# Patient Record
Sex: Female | Born: 1995 | Race: Black or African American | Hispanic: No | Marital: Single | Smoking: Never smoker
Health system: Southern US, Community
[De-identification: ages and names within clinical notes are randomized; demographics above are authoritative.]

## PROBLEM LIST (undated history)

## (undated) ENCOUNTER — Inpatient Hospital Stay (HOSPITAL_COMMUNITY): Payer: Self-pay

## (undated) ENCOUNTER — Ambulatory Visit: Admission: EM | Payer: Medicaid Other | Source: Home / Self Care

## (undated) DIAGNOSIS — B9689 Other specified bacterial agents as the cause of diseases classified elsewhere: Secondary | ICD-10-CM

## (undated) DIAGNOSIS — J45909 Unspecified asthma, uncomplicated: Secondary | ICD-10-CM

## (undated) DIAGNOSIS — N760 Acute vaginitis: Secondary | ICD-10-CM

## (undated) HISTORY — PX: NO PAST SURGERIES: SHX2092

---
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
## 2015-02-14 ENCOUNTER — Encounter (HOSPITAL_COMMUNITY): Payer: Self-pay | Admitting: Emergency Medicine

## 2015-02-14 ENCOUNTER — Emergency Department (HOSPITAL_COMMUNITY)
Admission: EM | Admit: 2015-02-14 | Discharge: 2015-02-14 | Discharge status: Against Medical Advice | Payer: Self-pay | Attending: Emergency Medicine | Admitting: Emergency Medicine

## 2015-02-14 DIAGNOSIS — J45901 Unspecified asthma with (acute) exacerbation: Secondary | ICD-10-CM | POA: Insufficient documentation

## 2015-02-14 MED ORDER — ALBUTEROL SULFATE (2.5 MG/3ML) 0.083% IN NEBU: 5.0000 mg | INHALATION_SOLUTION | Freq: Once | RESPIRATORY_TRACT | Status: DC

## 2015-02-14 NOTE — ED Notes (Signed)
Called to treatment area x3

## 2015-02-14 NOTE — ED Notes (Signed)
Pt states she has a history of asthma and has been having flare up's "since the season changed." Presents with no obvious difficulty breathing. States she's out of her inhaler.

## 2015-02-14 NOTE — ED Notes (Signed)
Pt called to treatment area x 2

## 2015-02-27 ENCOUNTER — Emergency Department (HOSPITAL_COMMUNITY)
Admission: EM | Admit: 2015-02-27 | Discharge: 2015-02-27 | Discharge status: Against Medical Advice | Payer: Self-pay | Attending: Emergency Medicine | Admitting: Emergency Medicine

## 2015-02-27 ENCOUNTER — Encounter (HOSPITAL_COMMUNITY): Payer: Self-pay | Admitting: Emergency Medicine

## 2015-02-27 DIAGNOSIS — J45901 Unspecified asthma with (acute) exacerbation: Secondary | ICD-10-CM | POA: Insufficient documentation

## 2015-02-27 DIAGNOSIS — Z880 Allergy status to penicillin: Secondary | ICD-10-CM | POA: Insufficient documentation

## 2015-02-27 MED ORDER — PREDNISONE 20 MG PO TABS
40.0000 mg | ORAL_TABLET | Freq: Once | ORAL | Status: AC
  Administered 2015-02-27: 40 mg via ORAL
  Filled 2015-02-27: qty 2

## 2015-02-27 MED ORDER — IPRATROPIUM-ALBUTEROL 0.5-2.5 (3) MG/3ML IN SOLN
3.0000 mL | Freq: Once | RESPIRATORY_TRACT | Status: AC
  Administered 2015-02-27: 3 mL via RESPIRATORY_TRACT
  Filled 2015-02-27: qty 3

## 2015-02-27 NOTE — ED Notes (Signed)
Went to check on pt, pt was not in room. Waited 15 minutes, pt still not in room. Pt not in waiting room or bathrooms. PA notified.

## 2015-02-27 NOTE — ED Provider Notes (Signed)
CSN: [id number]     Arrival date & time 02/27/15  1218 History  This chart was scribed for non-physician practitioner Karmen Stabs, PA-C, working with Benjiman Core, MD, by Andrew Au, ED Scribe. This patient was seen in room WTR5/WTR5 and the patient's care was started at 12:47 PM.  Chief Complaint  Patient presents with  . Asthma   The history is provided by the patient. No language interpreter was used.   Jodi Roth is a 19 y.o. female with hx of allergies who presents to the Emergency Department for asthma that began last night. Patient reports symptoms of SOB, productive cough with thick yellow mucous, and chest tightness which she states are normal symptoms of asthma for her. Pt denies taking medication or using an inhaler for symptoms. She states she recently moved here 2 months ago and does not have a PCP at this time. Pt has been hospitalized in past for asthma, last hospitalization being 5 months ago. She denies fever and chills.   Past Medical History  Diagnosis Date  . Asthma    History reviewed. No pertinent past surgical history. History reviewed. No pertinent family history. History  Substance Use Topics  . Smoking status: Never Smoker   . Smokeless tobacco: Not on file  . Alcohol Use: No   OB History    No data available     Review of Systems  Constitutional: Negative for fever and chills.  Respiratory: Positive for cough and chest tightness.   Gastrointestinal: Negative for nausea and vomiting.    Allergies  Eggs or egg-derived products and Penicillins  Home Medications   Prior to Admission medications   Not on File   BP 113/63 mmHg  Pulse 78  Temp(Src) 98.1 F (36.7 C) (Oral)  Resp 18  SpO2 100%  LMP 02/14/2015 (Approximate) Physical Exam  Constitutional: She appears well-developed and well-nourished. No distress.  HENT:  Head: Normocephalic and atraumatic.  Mouth/Throat: Posterior oropharyngeal erythema ( mild) present.  Eyes:  Conjunctivae and EOM are normal. Right eye exhibits no discharge. Left eye exhibits no discharge.  Cardiovascular: Normal rate and regular rhythm.   Pulmonary/Chest: Effort normal. No respiratory distress. She has wheezes.  Mild diffuse wheezing with good air movement, no respiratory distress.   Abdominal: Soft. Bowel sounds are normal. She exhibits no distension. There is no tenderness.  Neurological: She is alert. She exhibits normal muscle tone. Coordination normal.  Skin: Skin is warm and dry. She is not diaphoretic.  Nursing note and vitals reviewed.   ED Course  Procedures  DIAGNOSTIC STUDIES: Oxygen Saturation is 100% on RA, normal by my interpretation.    COORDINATION OF CARE: 12:57 PM- Pt advised of plan for treatment which includes a breathing treatment and prednisone and pt agrees.  Labs Review Labs Reviewed - No data to display  Imaging Review No results found.   EKG Interpretation None        MDM   Final diagnoses:  Asthma exacerbation   Patient presenting with symptoms of asthma exacerbation. Pt endorses wheezing and SOB typical of asthma flare. Pt denies worsening than normal. Pt denies PCP and states she has not been using inhalers. VSS. No hypoxia or tachypnea. No fevers. No respiratory distress. Pt endorses sputum production but states it is normal for her allergies. No fevers in ED. Pt given option of CXR and refused. I doubt PNA. Pt with mild asthma exacerbation. Pt given breathing treatment and prednisone in ED. Pt left before reassessment. Pt did not inform  nursing staff or provider. Had discussed prior to elopement return precautions and pt verbalized understanding. 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   I personally performed the services described in this documentation, which was scribed in my presence. The recorded information has been reviewed and is accurate.   Oswaldo ConroyVictoria Briani Maul, PA-C 02/27/15 1508  Benjiman CoreNathan Pickering,  MD 02/28/15 [phone]

## 2015-02-27 NOTE — ED Notes (Signed)
Pt reports asthma exacerbation starting 2 days ago while cleaning. Denies SOB but just feels "tightness" when she breathes. Attempted to be seen at PCP but says they didn't accept walk-ins. Slight wheezing on expiration initially, lung sounds coarse. RR even/unlabored. Requests a "proair inhaler." No other c/c. Speaking full/clear sentences.

## 2015-07-27 ENCOUNTER — Emergency Department (HOSPITAL_COMMUNITY)
Admission: EM | Admit: 2015-07-27 | Discharge: 2015-07-27 | Discharge status: Against Medical Advice | Payer: Self-pay | Attending: Emergency Medicine | Admitting: Emergency Medicine

## 2015-07-27 ENCOUNTER — Encounter (HOSPITAL_COMMUNITY): Payer: Self-pay | Admitting: Emergency Medicine

## 2015-07-27 DIAGNOSIS — J45909 Unspecified asthma, uncomplicated: Secondary | ICD-10-CM | POA: Insufficient documentation

## 2015-07-27 DIAGNOSIS — N898 Other specified noninflammatory disorders of vagina: Secondary | ICD-10-CM | POA: Insufficient documentation

## 2015-07-27 NOTE — ED Notes (Signed)
CALLED FOR ROOM X3

## 2015-07-27 NOTE — ED Notes (Signed)
C/o vaginal itching and dc X 3days, no V/D or abd pain, A/O X4, NAD

## 2015-09-24 ENCOUNTER — Encounter (HOSPITAL_COMMUNITY): Payer: Self-pay | Admitting: Emergency Medicine

## 2015-09-24 ENCOUNTER — Emergency Department (HOSPITAL_COMMUNITY): Payer: Self-pay

## 2015-09-24 ENCOUNTER — Emergency Department (HOSPITAL_COMMUNITY)
Admission: EM | Admit: 2015-09-24 | Discharge: 2015-09-24 | Disposition: A | Discharge status: Home / Self Care | Payer: Self-pay | Attending: Emergency Medicine | Admitting: Emergency Medicine

## 2015-09-24 DIAGNOSIS — Z880 Allergy status to penicillin: Secondary | ICD-10-CM | POA: Insufficient documentation

## 2015-09-24 DIAGNOSIS — N760 Acute vaginitis: Secondary | ICD-10-CM

## 2015-09-24 DIAGNOSIS — O23599 Infection of other part of genital tract in pregnancy, unspecified trimester: Secondary | ICD-10-CM | POA: Insufficient documentation

## 2015-09-24 DIAGNOSIS — R109 Unspecified abdominal pain: Secondary | ICD-10-CM

## 2015-09-24 DIAGNOSIS — O99519 Diseases of the respiratory system complicating pregnancy, unspecified trimester: Secondary | ICD-10-CM | POA: Insufficient documentation

## 2015-09-24 DIAGNOSIS — B379 Candidiasis, unspecified: Secondary | ICD-10-CM | POA: Insufficient documentation

## 2015-09-24 DIAGNOSIS — Z3A00 Weeks of gestation of pregnancy not specified: Secondary | ICD-10-CM | POA: Insufficient documentation

## 2015-09-24 DIAGNOSIS — J45909 Unspecified asthma, uncomplicated: Secondary | ICD-10-CM | POA: Insufficient documentation

## 2015-09-24 DIAGNOSIS — B9689 Other specified bacterial agents as the cause of diseases classified elsewhere: Secondary | ICD-10-CM

## 2015-09-24 DIAGNOSIS — R1030 Lower abdominal pain, unspecified: Secondary | ICD-10-CM | POA: Insufficient documentation

## 2015-09-24 DIAGNOSIS — N898 Other specified noninflammatory disorders of vagina: Secondary | ICD-10-CM

## 2015-09-24 DIAGNOSIS — Z3490 Encounter for supervision of normal pregnancy, unspecified, unspecified trimester: Secondary | ICD-10-CM

## 2015-09-24 DIAGNOSIS — O98819 Other maternal infectious and parasitic diseases complicating pregnancy, unspecified trimester: Secondary | ICD-10-CM | POA: Insufficient documentation

## 2015-09-24 LAB — CBC WITH DIFFERENTIAL/PLATELET
BASOS ABS: 0 10*3/uL (ref 0.0–0.1)
Basophils Relative: 0 %
Eosinophils Absolute: 0.2 10*3/uL (ref 0.0–0.7)
Eosinophils Relative: 2 %
HEMATOCRIT: 34.8 % — AB (ref 36.0–46.0)
Hemoglobin: 11.4 g/dL — ABNORMAL LOW (ref 12.0–15.0)
LYMPHS PCT: 25 %
Lymphs Abs: 2.2 10*3/uL (ref 0.7–4.0)
MCH: 27.6 pg (ref 26.0–34.0)
MCHC: 32.8 g/dL (ref 30.0–36.0)
MCV: 84.3 fL (ref 78.0–100.0)
MONO ABS: 0.7 10*3/uL (ref 0.1–1.0)
MONOS PCT: 8 %
Neutro Abs: 5.8 10*3/uL (ref 1.7–7.7)
Neutrophils Relative %: 65 %
Platelets: 325 10*3/uL (ref 150–400)
RBC: 4.13 MIL/uL (ref 3.87–5.11)
RDW: 14.6 % (ref 11.5–15.5)
WBC: 8.9 10*3/uL (ref 4.0–10.5)

## 2015-09-24 LAB — COMPREHENSIVE METABOLIC PANEL
ALT: 14 U/L (ref 14–54)
ANION GAP: 6 (ref 5–15)
AST: 20 U/L (ref 15–41)
Albumin: 3.9 g/dL (ref 3.5–5.0)
Alkaline Phosphatase: 51 U/L (ref 38–126)
BILIRUBIN TOTAL: 0.4 mg/dL (ref 0.3–1.2)
BUN: 10 mg/dL (ref 6–20)
CALCIUM: 9.3 mg/dL (ref 8.9–10.3)
CO2: 21 mmol/L — ABNORMAL LOW (ref 22–32)
Chloride: 109 mmol/L (ref 101–111)
Creatinine, Ser: 0.71 mg/dL (ref 0.44–1.00)
Glucose, Bld: 88 mg/dL (ref 65–99)
POTASSIUM: 3.8 mmol/L (ref 3.5–5.1)
Sodium: 136 mmol/L (ref 135–145)
TOTAL PROTEIN: 6.8 g/dL (ref 6.5–8.1)

## 2015-09-24 LAB — URINALYSIS, ROUTINE W REFLEX MICROSCOPIC
Bilirubin Urine: NEGATIVE
Glucose, UA: NEGATIVE mg/dL
Hgb urine dipstick: NEGATIVE
KETONES UR: NEGATIVE mg/dL
LEUKOCYTES UA: NEGATIVE
NITRITE: NEGATIVE
Protein, ur: NEGATIVE mg/dL
Specific Gravity, Urine: 1.022 (ref 1.005–1.030)
pH: 8.5 — ABNORMAL HIGH (ref 5.0–8.0)

## 2015-09-24 LAB — WET PREP, GENITAL
Sperm: NONE SEEN
TRICH WET PREP: NONE SEEN

## 2015-09-24 LAB — URINE MICROSCOPIC-ADD ON: Bacteria, UA: NONE SEEN

## 2015-09-24 LAB — HCG, QUANTITATIVE, PREGNANCY: hCG, Beta Chain, Quant, S: 7386 m[IU]/mL — ABNORMAL HIGH (ref <5)

## 2015-09-24 LAB — POC URINE PREG, ED: Preg Test, Ur: POSITIVE — AB

## 2015-09-24 MED ORDER — METRONIDAZOLE 500 MG PO TABS: 500.0000 mg | ORAL_TABLET | Freq: Two times a day (BID) | ORAL | Status: AC

## 2015-09-24 MED ORDER — CLOTRIMAZOLE 1 % VA CREA: 1.0000 | TOPICAL_CREAM | Freq: Every day | VAGINAL | Status: AC

## 2015-09-24 NOTE — ED Provider Notes (Signed)
CSN: [id number]     Arrival date & time 09/24/15  1912 History   First MD Initiated Contact with Patient 09/24/15 1922     Chief Complaint  Patient presents with  . Abdominal Pain     (Consider location/radiation/quality/duration/timing/severity/associated sxs/prior Treatment) Patient is a 19 y.o. female presenting with abdominal pain.  Abdominal Pain Pain location:  Suprapubic Pain quality: sharp   Pain radiates to:  Does not radiate Pain severity:  Severe Onset quality:  Sudden Duration:  1 week Timing:  Intermittent Progression:  Worsening Chronicity:  New Relieved by:  None tried Worsened by:  Nothing tried Associated symptoms: vaginal discharge (clear)   Associated symptoms: no anorexia, no chest pain, no cough, no diarrhea, no fatigue, no fever, no nausea, no shortness of breath, no sore throat, no vaginal bleeding and no vomiting   Risk factors: has not had multiple surgeries     Past Medical History  Diagnosis Date  . Asthma    History reviewed. No pertinent past surgical history. No family history on file. Social History  Substance Use Topics  . Smoking status: Never Smoker   . Smokeless tobacco: None  . Alcohol Use: No   OB History    No data available     Review of Systems  Constitutional: Negative for fever and fatigue.  HENT: Negative for sore throat.   Eyes: Negative for visual disturbance.  Respiratory: Negative for cough and shortness of breath.   Cardiovascular: Negative for chest pain.  Gastrointestinal: Positive for abdominal pain. Negative for nausea, vomiting, diarrhea and anorexia.  Genitourinary: Positive for vaginal discharge (clear). Negative for vaginal bleeding and difficulty urinating.  Musculoskeletal: Negative for back pain and neck pain.  Skin: Negative for rash.  Neurological: Negative for syncope and headaches.      Allergies  Penicillins and Eggs or egg-derived products  Home Medications   Prior to Admission  medications   Medication Sig Start Date End Date Taking? Authorizing Provider  albuterol (PROVENTIL HFA;VENTOLIN HFA) 108 (90 BASE) MCG/ACT inhaler Inhale 2 puffs into the lungs every 6 (six) hours as needed for wheezing or shortness of breath.   Yes Historical Provider, MD  clotrimazole (GYNE-LOTRIMIN) 1 % vaginal cream Place 1 Applicatorful vaginally at bedtime. 09/24/15 10/01/15  Alvira Monday, MD  metroNIDAZOLE (FLAGYL) 500 MG tablet Take 1 tablet (500 mg total) by mouth 2 (two) times daily. 09/24/15 10/01/15  Alvira Monday, MD   BP 118/70 mmHg  Pulse 81  Temp(Src) 98.8 F (37.1 C) (Oral)  Resp 14  SpO2 100% Physical Exam  Constitutional: She is oriented to person, place, and time. She appears well-developed and well-nourished. No distress.  HENT:  Head: Normocephalic and atraumatic.  Eyes: Conjunctivae and EOM are normal.  Neck: Normal range of motion.  Cardiovascular: Normal rate, regular rhythm, normal heart sounds and intact distal pulses.  Exam reveals no gallop and no friction rub.   No murmur heard. Pulmonary/Chest: Effort normal and breath sounds normal. No respiratory distress. She has no wheezes. She has no rales.  Abdominal: Soft. She exhibits no distension. There is tenderness (mild suprapubic). There is no guarding.  Genitourinary: Uterus is enlarged. Uterus is not tender. Cervix exhibits discharge. Cervix exhibits no motion tenderness. Right adnexum displays no tenderness and no fullness. Left adnexum displays no tenderness and no fullness.  Musculoskeletal: She exhibits no edema or tenderness.  Neurological: She is alert and oriented to person, place, and time.  Skin: Skin is warm and dry. No rash noted. She  is not diaphoretic. No erythema.  Nursing note and vitals reviewed.   ED Course  Procedures (including critical care time) Labs Review Labs Reviewed  WET PREP, GENITAL - Abnormal; Notable for the following:    Yeast Wet Prep HPF POC PRESENT (*)    Clue  Cells Wet Prep HPF POC PRESENT (*)    WBC, Wet Prep HPF POC MANY (*)    All other components within normal limits  URINALYSIS, ROUTINE W REFLEX MICROSCOPIC (NOT AT [facility]) - Abnormal; Notable for the following:    APPearance TURBID (*)    pH 8.5 (*)    All other components within normal limits  HCG, QUANTITATIVE, PREGNANCY - Abnormal; Notable for the following:    hCG, Beta Chain, Quant, S 7386 (*)    All other components within normal limits  CBC WITH DIFFERENTIAL/PLATELET - Abnormal; Notable for the following:    Hemoglobin 11.4 (*)    HCT 34.8 (*)    All other components within normal limits  COMPREHENSIVE METABOLIC PANEL - Abnormal; Notable for the following:    CO2 21 (*)    All other components within normal limits  URINE MICROSCOPIC-ADD ON - Abnormal; Notable for the following:    Squamous Epithelial / LPF 6-30 (*)    All other components within normal limits  POC URINE PREG, ED - Abnormal; Notable for the following:    Preg Test, Ur POSITIVE (*)    All other components within normal limits  RPR  HIV ANTIBODY (ROUTINE TESTING)  GC/CHLAMYDIA PROBE AMP ([place]) NOT AT [facility]    Imaging Review [place] Ob Comp Less 14 Wks  09/24/2015  CLINICAL DATA:  Abdominal pain in first trimester pregnancy. EXAM: OBSTETRIC <14 WK US AND TRANSVAGINAL OB US TECHNIQUE: Both transabdominal and transvaginal ultrasound examinations were performed for complete evaluation of the gestation as well as the maternal uterus, adnexal regions, and pelvic cul-de-sac. Transvaginal technique was performed to assess early pregnancy. COMPARISON:  None. FINDINGS: Intrauterine gestational sac: Likely present Yolk sac:  Not confidently identified Embryo:  Not seen MSD: 7.3  mm   5 w   3  d         US EDC: 05/23/2016 Maternal uterus/adnexae: Physiologic appearance of the ovaries. No free pelvic fluid or subchorionic hemorrhage. IMPRESSION: Probable early intrauterine gestational sac, but no yolk sac or fetal pole yet  visualized. Recommend follow-up quantitative B-HCG levels and follow-up US in 14 days to confirm and assess viability. This recommendation follows SRU consensus guidelines: Diagnostic Criteria for Nonviable Pregnancy Early in the First Trimester. Malva Limes Engl J Med 2013; 161:0960-45; 369:1443-51. Electronically Signed   By: Marnee SpringJonathon  Watts M.D.   On: 09/24/2015 22:44   [place] Ob Transvaginal  09/24/2015  CLINICAL DATA:  Abdominal pain in first trimester pregnancy. EXAM: OBSTETRIC <14 WK US AND TRANSVAGINAL OB US TECHNIQUE: Both transabdominal and transvaginal ultrasound examinations were performed for complete evaluation of the gestation as well as the maternal uterus, adnexal regions, and pelvic cul-de-sac. Transvaginal technique was performed to assess early pregnancy. COMPARISON:  None. FINDINGS: Intrauterine gestational sac: Likely present Yolk sac:  Not confidently identified Embryo:  Not seen MSD: 7.3  mm   5 w   3  d         US EDC: 05/23/2016 Maternal uterus/adnexae: Physiologic appearance of the ovaries. No free pelvic fluid or subchorionic hemorrhage. IMPRESSION: Probable early intrauterine gestational sac, but no yolk sac or fetal pole yet visualized. Recommend follow-up quantitative B-HCG levels and follow-up [place]  in 14 days to confirm and assess viability. This recommendation follows SRU consensus guidelines: Diagnostic Criteria for Nonviable Pregnancy Early in the First Trimester. Malva Limes Med 2013; 409:8119-14. Electronically Signed   By: Marnee Spring M.D.   On: 09/24/2015 22:44   I have personally reviewed and evaluated these images and lab results as part of my medical decision-making.   EKG Interpretation None      MDM   Final diagnoses:  Abdominal pain  Pregnancy of unclear location  Vaginal discharge  Yeast infection  Bacterial vaginosis   19 year old female in no static and medical history presents with concern of lower abdominal pain. Pregnancy test is positive. Genital exam was ordered to  evaluate for signs of ectopic pregnancy and shows a gestational sac with no sign of yolk sac or fetal pole. Discussed with patient that this still represents a pregnancy of unknown location, and recommend follow-up hCG in 2 days as well as ultrasound 2 weeks.  Have low suspicion for other etiology of abdominal pain. Wet prep shows yeast, and patient was given topical treatment prescription, clue cells are present and she is given a prescription for Flagyl. Discussed that would send gonorrhea, chlamydia, HIV, RPR and patient will be called regarding these findings. Recommended follow-up with the health department or women's clinic for further evaluation. Recommended prenatal vitamins, and avoidance of other medications are not recommended in pregnancy.  Patient discharged in stable condition with understanding of reasons to return.  Pt to have close follow up for pregnancy of unknown location.   Alvira Monday, MD 09/25/15 [phone]

## 2015-09-24 NOTE — ED Notes (Signed)
Pt. reports intermittent low abdominal pain onset last week , denies pain at arrival , no emesis or diarrhea , denies dysuria or fever.

## 2015-09-24 NOTE — ED Notes (Signed)
Patient transported to Ultrasound 

## 2015-09-25 LAB — GC/CHLAMYDIA PROBE AMP (~~LOC~~) NOT AT ARMC
Chlamydia: NEGATIVE
Neisseria Gonorrhea: NEGATIVE

## 2015-09-25 LAB — RPR: RPR: NONREACTIVE

## 2015-09-25 LAB — HIV ANTIBODY (ROUTINE TESTING W REFLEX): HIV Screen 4th Generation wRfx: NONREACTIVE

## 2015-09-27 ENCOUNTER — Other Ambulatory Visit: Payer: Self-pay

## 2015-09-27 DIAGNOSIS — Z3490 Encounter for supervision of normal pregnancy, unspecified, unspecified trimester: Secondary | ICD-10-CM

## 2015-09-27 NOTE — Progress Notes (Unsigned)
Patient here for repeat bhcg. ER notes recommend f/u ultrasound in 2 weeks. Scheduled for 1/4 @ 9am. Patient informed and had no questions

## 2015-09-28 LAB — HCG, QUANTITATIVE, PREGNANCY: HCG, BETA CHAIN, QUANT, S: 14630.6 m[IU]/mL — AB

## 2015-10-08 NOTE — L&D Delivery Note (Signed)
Delivery Note Pt pushed well and at 1352 a viable female was delivered via  (Presentation: ;LOA  ).  APGAR: 8, 9 ; weight  5lb 4.3oz.   Placenta status: spont, intact .  Cord:  3 vessel   Anesthesia:  epidural Episiotomy:  none Lacerations:  1st degree periurethral Suture Repair: 3.0 monocryl Est. Blood Loss (mL):  100  Mom to postpartum.  Baby to Couplet care / Skin to Skin.  Dr Chanetta Marshall did the delivery and repair under my supervision.  Cam Hai CNM 05/08/2016, 4:20 PM

## 2015-10-11 ENCOUNTER — Ambulatory Visit (HOSPITAL_COMMUNITY)
Admission: RE | Admit: 2015-10-11 | Discharge: 2015-10-11 | Disposition: A | Discharge status: Home / Self Care | Payer: BLUE CROSS/BLUE SHIELD | Source: Ambulatory Visit | Attending: Family Medicine | Admitting: Family Medicine

## 2015-10-11 DIAGNOSIS — Z3490 Encounter for supervision of normal pregnancy, unspecified, unspecified trimester: Secondary | ICD-10-CM

## 2015-10-11 DIAGNOSIS — Z36 Encounter for antenatal screening of mother: Secondary | ICD-10-CM | POA: Diagnosis not present

## 2015-10-16 ENCOUNTER — Other Ambulatory Visit: Payer: Self-pay | Admitting: Obstetrics and Gynecology

## 2015-10-16 DIAGNOSIS — Z3490 Encounter for supervision of normal pregnancy, unspecified, unspecified trimester: Secondary | ICD-10-CM | POA: Insufficient documentation

## 2015-11-15 LAB — OB RESULTS CONSOLE HEPATITIS B SURFACE ANTIGEN: Hepatitis B Surface Ag: NEGATIVE

## 2015-11-15 LAB — OB RESULTS CONSOLE RUBELLA ANTIBODY, IGM: RUBELLA: IMMUNE

## 2015-11-28 ENCOUNTER — Emergency Department (HOSPITAL_COMMUNITY)
Admission: EM | Admit: 2015-11-28 | Discharge: 2015-11-28 | Disposition: A | Discharge status: Home / Self Care | Payer: BLUE CROSS/BLUE SHIELD | Source: Home / Self Care

## 2015-11-28 ENCOUNTER — Inpatient Hospital Stay (HOSPITAL_COMMUNITY)
Admission: AD | Admit: 2015-11-28 | Discharge: 2015-11-28 | Disposition: A | Discharge status: Home / Self Care | Payer: BLUE CROSS/BLUE SHIELD | Source: Ambulatory Visit | Attending: Obstetrics | Admitting: Obstetrics

## 2015-11-28 ENCOUNTER — Encounter (HOSPITAL_COMMUNITY): Payer: Self-pay | Admitting: Cardiology

## 2015-11-28 ENCOUNTER — Encounter (HOSPITAL_COMMUNITY): Payer: Self-pay | Admitting: *Deleted

## 2015-11-28 DIAGNOSIS — R109 Unspecified abdominal pain: Secondary | ICD-10-CM

## 2015-11-28 DIAGNOSIS — Z880 Allergy status to penicillin: Secondary | ICD-10-CM | POA: Insufficient documentation

## 2015-11-28 DIAGNOSIS — O26899 Other specified pregnancy related conditions, unspecified trimester: Secondary | ICD-10-CM

## 2015-11-28 DIAGNOSIS — O99511 Diseases of the respiratory system complicating pregnancy, first trimester: Secondary | ICD-10-CM

## 2015-11-28 DIAGNOSIS — M549 Dorsalgia, unspecified: Secondary | ICD-10-CM

## 2015-11-28 DIAGNOSIS — O9989 Other specified diseases and conditions complicating pregnancy, childbirth and the puerperium: Secondary | ICD-10-CM

## 2015-11-28 DIAGNOSIS — J45909 Unspecified asthma, uncomplicated: Secondary | ICD-10-CM | POA: Insufficient documentation

## 2015-11-28 DIAGNOSIS — N949 Unspecified condition associated with female genital organs and menstrual cycle: Secondary | ICD-10-CM

## 2015-11-28 DIAGNOSIS — Z3A14 14 weeks gestation of pregnancy: Secondary | ICD-10-CM

## 2015-11-28 DIAGNOSIS — R102 Pelvic and perineal pain: Secondary | ICD-10-CM | POA: Diagnosis not present

## 2015-11-28 LAB — COMPREHENSIVE METABOLIC PANEL
ALT: 12 U/L — ABNORMAL LOW (ref 14–54)
ANION GAP: 9 (ref 5–15)
AST: 17 U/L (ref 15–41)
Albumin: 3.6 g/dL (ref 3.5–5.0)
Alkaline Phosphatase: 33 U/L — ABNORMAL LOW (ref 38–126)
BUN: 7 mg/dL (ref 6–20)
CHLORIDE: 106 mmol/L (ref 101–111)
CO2: 21 mmol/L — AB (ref 22–32)
Calcium: 9.3 mg/dL (ref 8.9–10.3)
Creatinine, Ser: 0.58 mg/dL (ref 0.44–1.00)
GFR calc non Af Amer: >60 mL/min (ref >60)
Glucose, Bld: 88 mg/dL (ref 65–99)
POTASSIUM: 4 mmol/L (ref 3.5–5.1)
SODIUM: 136 mmol/L (ref 135–145)
Total Bilirubin: 0.5 mg/dL (ref 0.3–1.2)
Total Protein: 6.7 g/dL (ref 6.5–8.1)

## 2015-11-28 LAB — URINALYSIS, ROUTINE W REFLEX MICROSCOPIC
Bilirubin Urine: NEGATIVE
Glucose, UA: NEGATIVE mg/dL
Hgb urine dipstick: NEGATIVE
Ketones, ur: NEGATIVE mg/dL
Leukocytes, UA: NEGATIVE
Nitrite: NEGATIVE
Protein, ur: NEGATIVE mg/dL
SPECIFIC GRAVITY, URINE: 1.02 (ref 1.005–1.030)
pH: 7.5 (ref 5.0–8.0)

## 2015-11-28 LAB — CBC
HEMATOCRIT: 33.4 % — AB (ref 36.0–46.0)
HEMOGLOBIN: 11.2 g/dL — AB (ref 12.0–15.0)
MCH: 28 pg (ref 26.0–34.0)
MCHC: 33.5 g/dL (ref 30.0–36.0)
MCV: 83.5 fL (ref 78.0–100.0)
Platelets: 231 10*3/uL (ref 150–400)
RBC: 4 MIL/uL (ref 3.87–5.11)
RDW: 14.5 % (ref 11.5–15.5)
WBC: 8.8 10*3/uL (ref 4.0–10.5)

## 2015-11-28 LAB — LIPASE, BLOOD: LIPASE: 25 U/L (ref 11–51)

## 2015-11-28 NOTE — ED Notes (Signed)
Pt reports she is [redacted] weeks pregnant and has been having back pain and abd cramping for the past 2 days. Denies any vaginal bleeding or discharge.

## 2015-11-28 NOTE — MAU Note (Signed)
States she went to [facility] earlier. Left without being seen due to percieved wait time.

## 2015-11-28 NOTE — MAU Note (Signed)
Abd cramping X 3 days, worse with movement. No bleeding or leaking.

## 2015-11-28 NOTE — MAU Provider Note (Signed)
Chief Complaint: Abdominal Cramping   First Provider Initiated Contact with Patient 11/28/15 1258      SUBJECTIVE HPI: Jodi Roth is a 20 y.o. G1P0000 at [redacted]w[redacted]d by LMP who presents to maternity admissions reporting abdominal cramping last night and today that has improved by her MAU visit without treatment. She reports movement and walking make the pain worse, resting makes it go away.  She went to [facility] today but the wait was long so she left to come to MAU.  She denies vaginal bleeding, vaginal itching/burning, urinary symptoms, h/a, dizziness, n/v, or fever/chills.     HPI  Past Medical History  Diagnosis Date  . Asthma    History reviewed. No pertinent past surgical history. Social History   Social History  . Marital Status: Single    Spouse Name: N/A  . Number of Children: N/A  . Years of Education: N/A   Occupational History  . Not on file.   Social History Main Topics  . Smoking status: Never Smoker   . Smokeless tobacco: Not on file  . Alcohol Use: No  . Drug Use: No  . Sexual Activity: Yes   Other Topics Concern  . Not on file   Social History Narrative   No current facility-administered medications on file prior to encounter.   Current Outpatient Prescriptions on File Prior to Encounter  Medication Sig Dispense Refill  . albuterol (PROVENTIL HFA;VENTOLIN HFA) 108 (90 BASE) MCG/ACT inhaler Inhale 2 puffs into the lungs every 6 (six) hours as needed for wheezing or shortness of breath.     Allergies  Allergen Reactions  . Penicillins Anaphylaxis    Has patient had a PCN reaction causing immediate rash, facial/tongue/throat swelling, SOB or lightheadedness with hypotension: No Has patient had a PCN reaction causing severe rash involving mucus membranes or skin necrosis: Yes Has patient had a PCN reaction that required hospitalization Yes  If all of the above answers are "NO", then may proceed with Cephalosporin use.    . Eggs Or Egg-Derived Products  Nausea And Vomiting    ROS:  Review of Systems  Constitutional: Negative for fever, chills and fatigue.  Respiratory: Negative for shortness of breath.   Cardiovascular: Negative for chest pain.  Gastrointestinal: Positive for abdominal pain.  Genitourinary: Positive for pelvic pain. Negative for dysuria, flank pain, vaginal bleeding, vaginal discharge, difficulty urinating and vaginal pain.  Neurological: Negative for dizziness and headaches.  Psychiatric/Behavioral: Negative.      I have reviewed patient's Past Medical Hx, Surgical Hx, Family Hx, Social Hx, medications and allergies.   Physical Exam   Patient Vitals for the past 24 hrs:  BP Temp Temp src Pulse Resp  11/28/15 1411 116/60 mmHg - - 76 16  11/28/15 1240 111/59 mmHg 98.6 F (37 C) Oral 70 16   Constitutional: Well-developed, well-nourished female in no acute distress.  Cardiovascular: normal rate Respiratory: normal effort GI: Abd soft, non-tender. Pos BS x 4 MS: Extremities nontender, no edema, normal ROM Neurologic: Alert and oriented x 4.  GU: Neg CVAT.   FHT 150 by doppler  LAB RESULTS Results for orders placed or performed during the hospital encounter of 11/28/15 (from the past 24 hour(s))  Urinalysis, Routine w reflex microscopic (not at [facility])     Status: Abnormal   Collection Time: 11/28/15 12:30 PM  Result Value Ref Range   Color, Urine YELLOW YELLOW   APPearance HAZY (A) CLEAR   Specific Gravity, Urine 1.020 1.005 - 1.030   pH 7.5  5.0 - 8.0   Glucose, UA NEGATIVE NEGATIVE mg/dL   Hgb urine dipstick NEGATIVE NEGATIVE   Bilirubin Urine NEGATIVE NEGATIVE   Ketones, ur NEGATIVE NEGATIVE mg/dL   Protein, ur NEGATIVE NEGATIVE mg/dL   Nitrite NEGATIVE NEGATIVE   Leukocytes, UA NEGATIVE NEGATIVE       IMAGING No results found.  MAU Management/MDM: Ordered labs and reviewed results. No evidence of preterm labor or UTI.  Inguinal pain with movement is likely round ligament pain.  Increase PO  fluids, rest/ice/heat/warm bath/Tylenol for pain.  Pt stable at time of discharge.  ASSESSMENT 1. Round ligament pain   2. Abdominal pain affecting pregnancy, antepartum     PLAN Discharge home    Medication List    TAKE these medications        albuterol 108 (90 Base) MCG/ACT inhaler  Commonly known as:  PROVENTIL HFA;VENTOLIN HFA  Inhale 2 puffs into the lungs every 6 (six) hours as needed for wheezing or shortness of breath.     prenatal multivitamin Tabs tablet  Take 1 tablet by mouth daily at 12 noon.       Follow-up Information    Follow up with Kathreen Cosier, MD.   Specialty:  Obstetrics and Gynecology   Why:  As scheduled, Return to MAU as needed for emergencies   Contact information:   [address] [place] [address] [phone]       Sharen Counter Certified Nurse-Midwife 11/28/2015  2:46 PM

## 2015-11-28 NOTE — Discharge Instructions (Signed)
Round Ligament Pain °The round ligament is a cord of muscle and tissue that helps to support the uterus. It can become a source of pain during pregnancy if it becomes stretched or twisted as the baby grows. The pain usually begins in the second trimester of pregnancy, and it can come and go until the baby is delivered. It is not a serious problem, and it does not cause harm to the baby. °Round ligament pain is usually a short, sharp, and pinching pain, but it can also be a dull, lingering, and aching pain. The pain is felt in the lower side of the abdomen or in the groin. It usually starts deep in the groin and moves up to the outside of the hip area. Pain can occur with: °· A sudden change in position. °· Rolling over in bed. °· Coughing or sneezing. °· Physical activity. °HOME CARE INSTRUCTIONS °Watch your condition for any changes. Take these steps to help with your pain: °· When the pain starts, relax. Then try: °· Sitting down. °· Flexing your knees up to your abdomen. °· Lying on your side with one pillow under your abdomen and another pillow between your legs. °· Sitting in a warm bath for 15-20 minutes or until the pain goes away. °· Take over-the-counter and prescription medicines only as told by your health care provider. °· Move slowly when you sit and stand. °· Avoid long walks if they cause pain. °· Stop or lessen your physical activities if they cause pain. °SEEK MEDICAL CARE IF: °· Your pain does not go away with treatment. °· You feel pain in your back that you did not have before. °· Your medicine is not helping. °SEEK IMMEDIATE MEDICAL CARE IF: °· You develop a fever or chills. °· You develop uterine contractions. °· You develop vaginal bleeding. °· You develop nausea or vomiting. °· You develop diarrhea. °· You have pain when you urinate. °  °This information is not intended to replace advice given to you by your health care provider. Make sure you discuss any questions you have with your health  care provider. °  °Document Released: 07/02/2008 Document Revised: 12/16/2011 Document Reviewed: 11/30/2014 °Elsevier Interactive Patient Education ©2016 Else[employer] Inc[employer] ° °Abdominal Pain During Pregnancy °Abdominal pain is common in pregnancy. Most of the time, it does not cause harm. There are many causes of abdominal pain. Some causes are more serious than others. Some of the causes of abdominal pain in pregnancy are easily diagnosed. Occasionally, the diagnosis takes time to understand. Other times, the cause is not determined. Abdominal pain can be a sign that something is very wrong with the pregnancy, or the pain may have nothing to do with the pregnancy at all. For this reason, always tell your health care provider if you have any abdominal discomfort. °HOME CARE INSTRUCTIONS  °Monitor your abdominal pain for any changes. The following actions may help to alleviate any discomfort you are experiencing: °· Do not have sexual intercourse or put anything in your vagina until your symptoms go away completely. °· Get plenty of rest until your pain improves. °· Drink clear fluids if you feel nauseous. Avoid solid food as long as you are uncomfortable or nauseous. °· Only take over-the-counter or prescription medicine as directed by your health care provider. °· Keep all follow-up appointments with your health care provider. °SEEK IMMEDIATE MEDICAL CARE IF: °· You are bleeding, leaking fluid, or passing tissue from the vagina. °· You have increasing pain or cramping. °·   You have persistent vomiting. °· You have painful or bloody urination. °· You have a fever. °· You notice a decrease in your baby's movements. °· You have extreme weakness or feel faint. °· You have shortness of breath, with or without abdominal pain. °· You develop a severe headache with abdominal pain. °· You have abnormal vaginal discharge with abdominal pain. °· You have persistent diarrhea. °· You have abdominal pain that continues even after  rest, or gets worse. °MAKE SURE YOU:  °· Understand these instructions. °· Will watch your condition. °· Will get help right away if you are not doing well or get worse. °  °This information is not intended to replace advice given to you by your health care provider. Make sure you discuss any questions you have with your health care provider. °  °Document Released: 09/23/2005 Document Revised: 07/14/2013 Document Reviewed: 04/22/2013 °Elsevier Interactive Patient Education ©2016 Elsevier Inc. ° °

## 2016-01-17 IMAGING — US US OB TRANSVAGINAL
1 series · 14 of 28 positions shown · non-contrast
Comparison: None.

CLINICAL DATA: Abdominal pain in first trimester pregnancy.

EXAM:
OBSTETRIC <14 WK US AND TRANSVAGINAL OB US
TECHNIQUE: Both transabdominal and transvaginal ultrasound examinations were
performed for complete evaluation of the gestation as well as the
maternal uterus, adnexal regions, and pelvic cul-de-sac.
Transvaginal technique was performed to assess early pregnancy.

[Series 1: us ob transvaginal · 0.23mm/px · 14 of 37 slices shown]
[im 2/37]
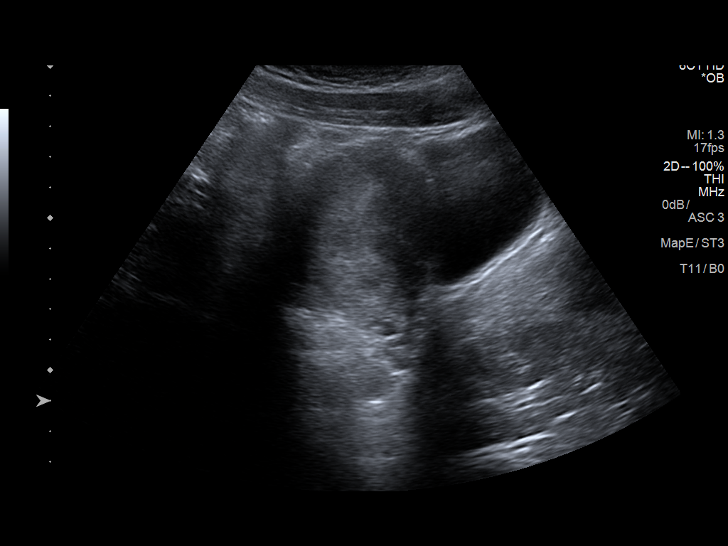
[im 5/37]
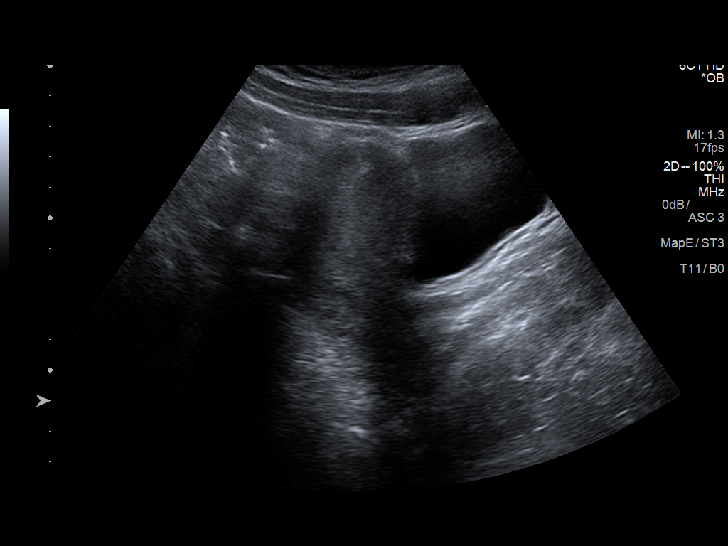
[im 7/37]
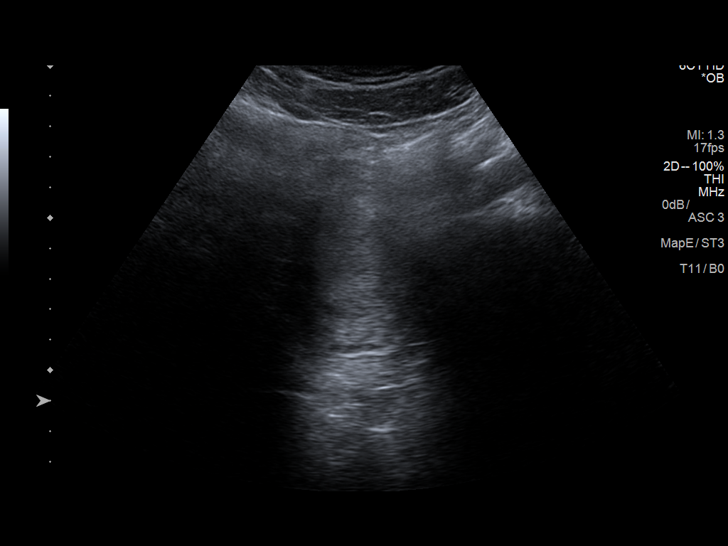
[im 10/37]
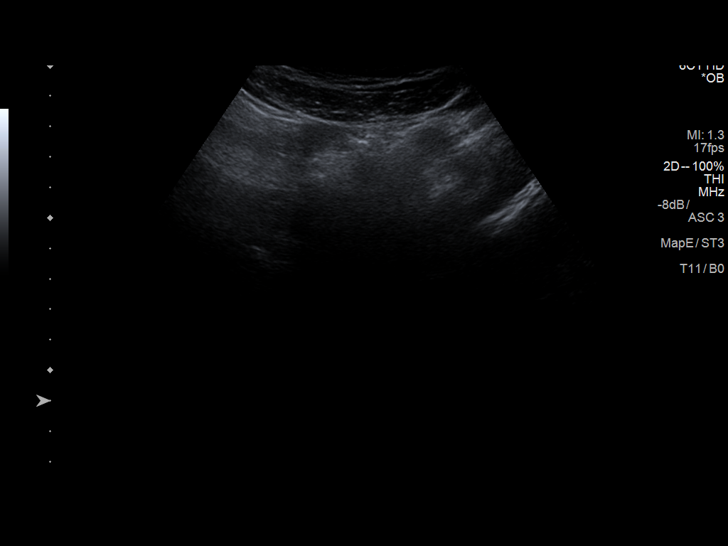
[im 13/37]
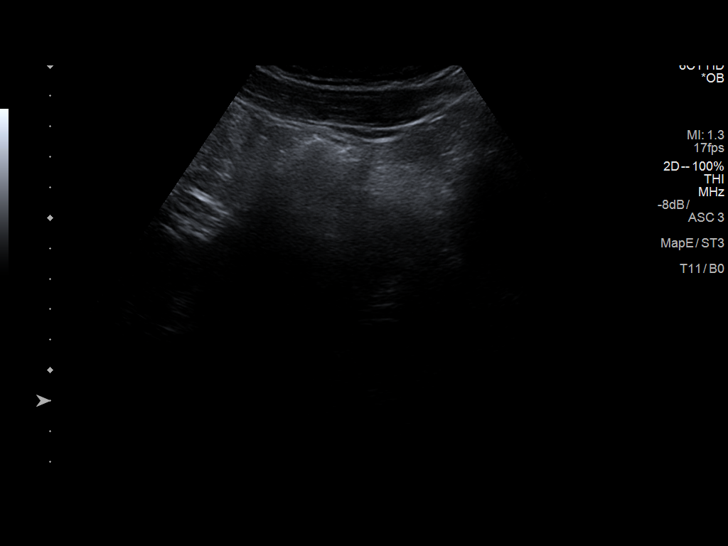
[im 15/37]
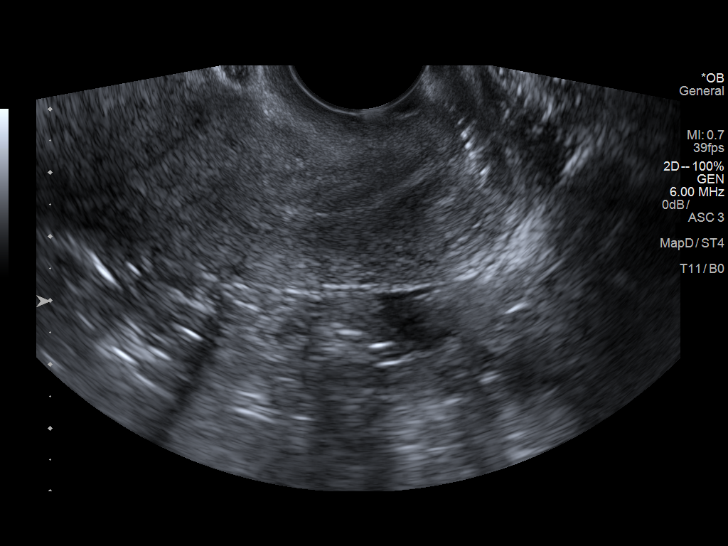
[im 18/37]
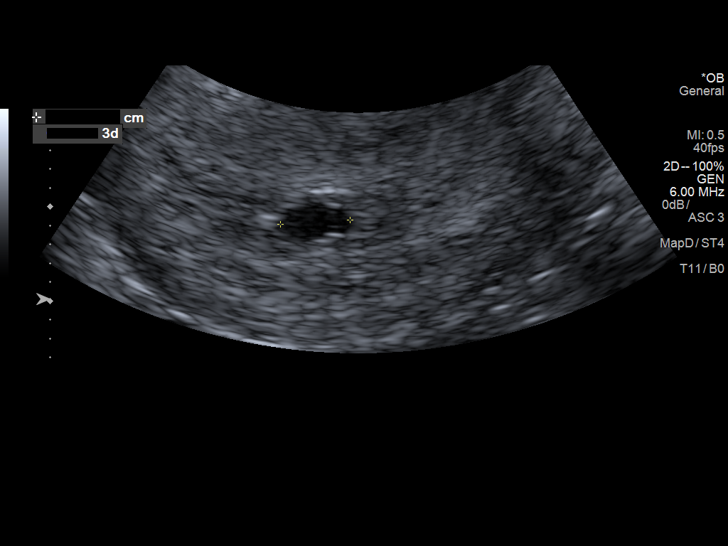
[im 21/37]
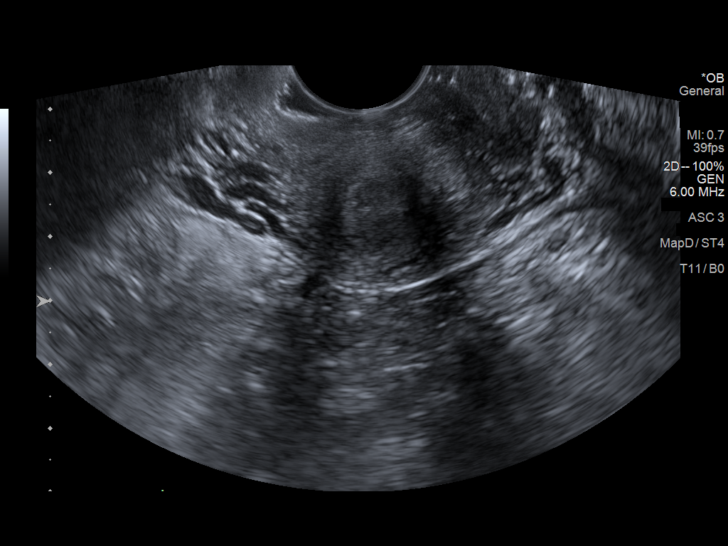
[im 23/37]
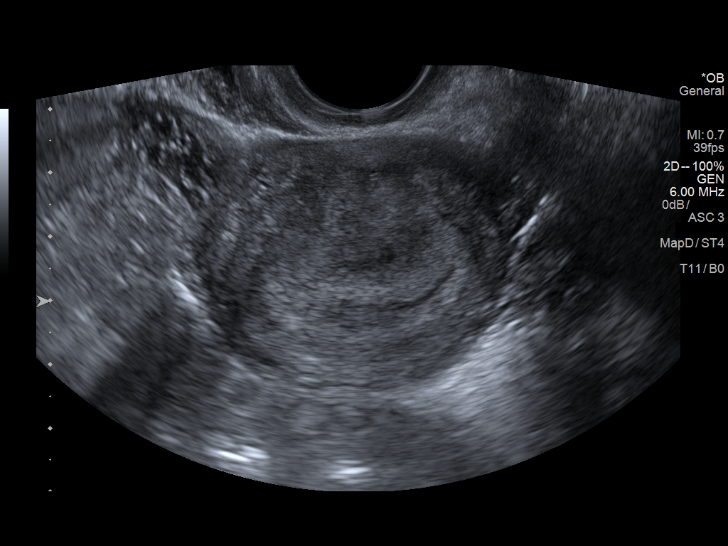
[im 26/37]
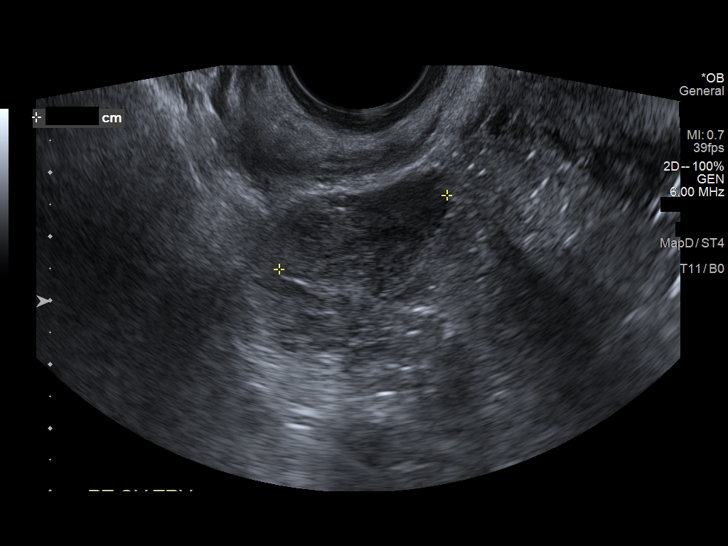
[im 29/37]
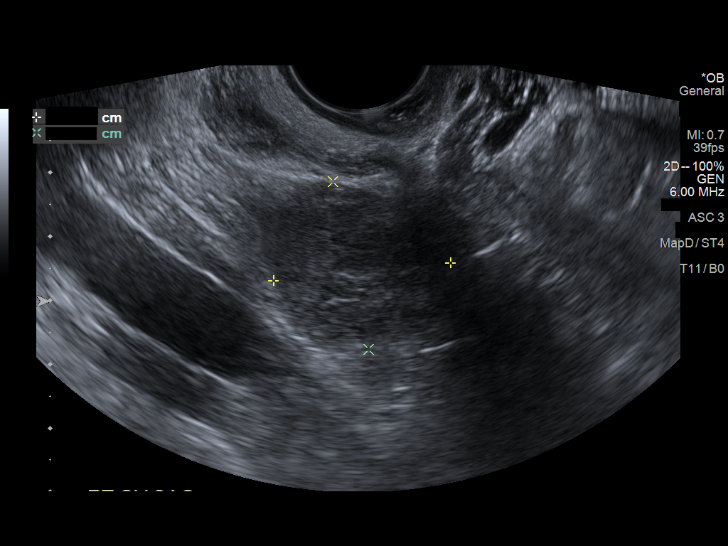
[im 31/37]
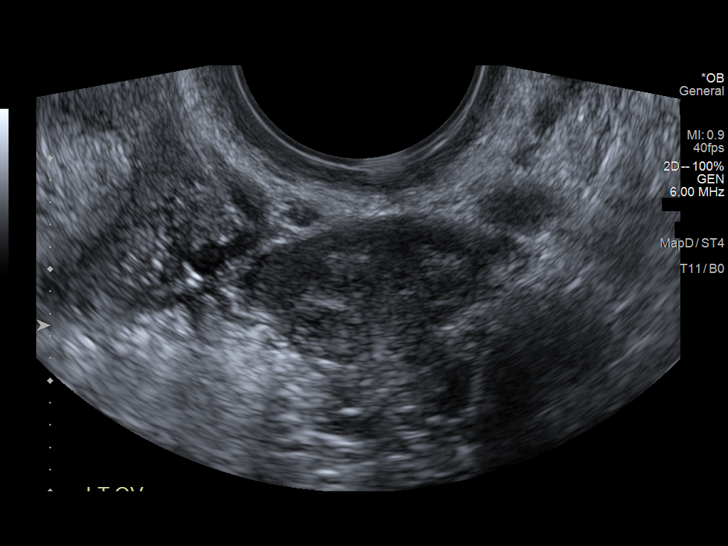
[im 34/37]
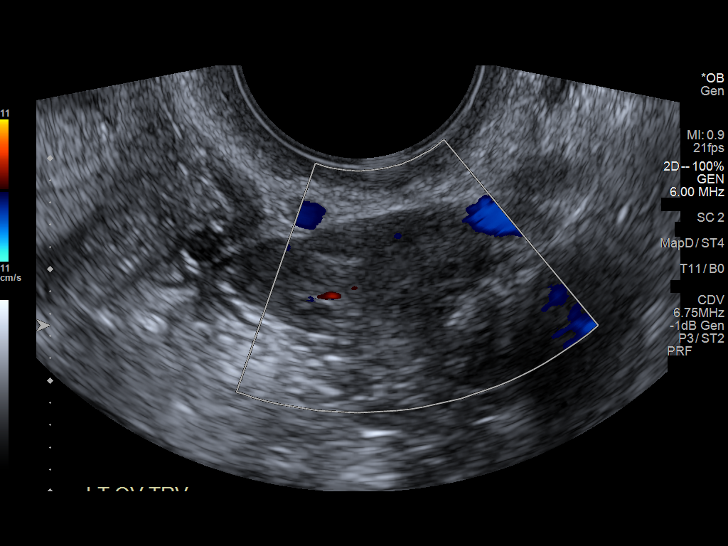
[im 37/37]
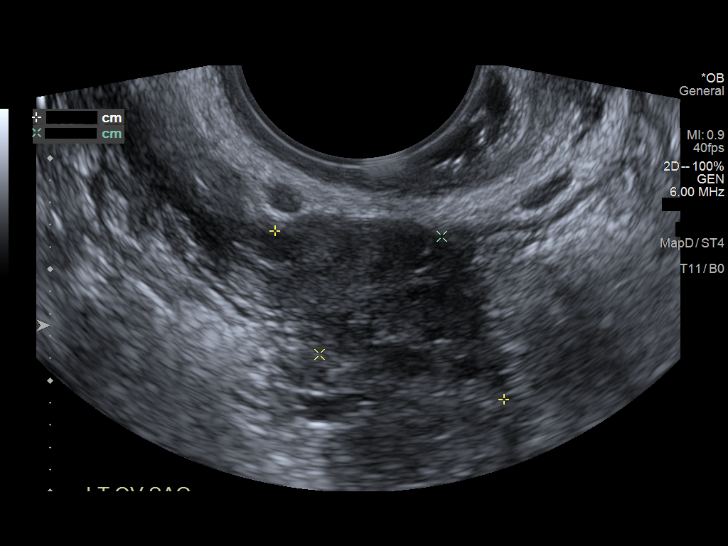

[14 of 28 positions shown; findings below may reference images not displayed]

FINDINGS: Intrauterine gestational sac: Likely present

Yolk sac:  Not confidently identified

Embryo:  Not seen

MSD: 7.3  mm   5 w   3  d         US EDC: 05/23/2016

Maternal uterus/adnexae: Physiologic appearance of the ovaries. No
free pelvic fluid or subchorionic hemorrhage.
IMPRESSION: Probable early intrauterine gestational sac, but no yolk sac or
fetal pole yet visualized. Recommend follow-up quantitative B-HCG
levels and follow-up US in 14 days to confirm and assess viability.
This recommendation follows SRU consensus guidelines: Diagnostic
Criteria for Nonviable Pregnancy Early in the First Trimester. N
Engl J Med 7799; [DATE].

## 2016-02-06 ENCOUNTER — Emergency Department (HOSPITAL_COMMUNITY)
Admission: EM | Admit: 2016-02-06 | Discharge: 2016-02-06 | Disposition: A | Discharge status: Home / Self Care | Payer: BLUE CROSS/BLUE SHIELD | Attending: Emergency Medicine | Admitting: Emergency Medicine

## 2016-02-06 ENCOUNTER — Encounter (HOSPITAL_COMMUNITY): Payer: Self-pay | Admitting: *Deleted

## 2016-02-06 DIAGNOSIS — O99512 Diseases of the respiratory system complicating pregnancy, second trimester: Secondary | ICD-10-CM | POA: Diagnosis not present

## 2016-02-06 DIAGNOSIS — J45901 Unspecified asthma with (acute) exacerbation: Secondary | ICD-10-CM | POA: Diagnosis not present

## 2016-02-06 DIAGNOSIS — Z3A24 24 weeks gestation of pregnancy: Secondary | ICD-10-CM | POA: Insufficient documentation

## 2016-02-06 DIAGNOSIS — O9989 Other specified diseases and conditions complicating pregnancy, childbirth and the puerperium: Secondary | ICD-10-CM | POA: Diagnosis present

## 2016-02-06 MED ORDER — ALBUTEROL SULFATE (2.5 MG/3ML) 0.083% IN NEBU
5.0000 mg | INHALATION_SOLUTION | Freq: Once | RESPIRATORY_TRACT | Status: AC
  Administered 2016-02-06: 5 mg via RESPIRATORY_TRACT
  Filled 2016-02-06: qty 6

## 2016-02-06 NOTE — ED Notes (Signed)
Pt states that she is feeling better after her breathing tx; pt states that she is going to wait to be seen by her OB / GYN at 10am and get her inhaler filled; pt left out of triage and out the ER doors; pt declined to sign out AMA

## 2016-02-06 NOTE — ED Notes (Addendum)
PT states that she began having an asthma attack and difficulty breathing 2 hrs ago; pt states that she does not have an inhaler or neb at home; pt states that she recently moved here and doesn't have a MD other than her OB; pt is [redacted] weeks pregnant; pt denies URI sx; audible wheezing in triage

## 2016-02-07 ENCOUNTER — Encounter (HOSPITAL_COMMUNITY): Payer: Self-pay | Admitting: Emergency Medicine

## 2016-02-07 ENCOUNTER — Emergency Department (HOSPITAL_COMMUNITY): Payer: BLUE CROSS/BLUE SHIELD

## 2016-02-07 ENCOUNTER — Emergency Department (HOSPITAL_COMMUNITY)
Admission: EM | Admit: 2016-02-07 | Discharge: 2016-02-07 | Disposition: A | Discharge status: Home / Self Care | Payer: BLUE CROSS/BLUE SHIELD | Attending: Emergency Medicine | Admitting: Emergency Medicine

## 2016-02-07 DIAGNOSIS — Z7951 Long term (current) use of inhaled steroids: Secondary | ICD-10-CM | POA: Diagnosis not present

## 2016-02-07 DIAGNOSIS — J45901 Unspecified asthma with (acute) exacerbation: Secondary | ICD-10-CM | POA: Diagnosis present

## 2016-02-07 DIAGNOSIS — J4531 Mild persistent asthma with (acute) exacerbation: Secondary | ICD-10-CM | POA: Insufficient documentation

## 2016-02-07 DIAGNOSIS — Z7952 Long term (current) use of systemic steroids: Secondary | ICD-10-CM | POA: Diagnosis not present

## 2016-02-07 DIAGNOSIS — Z79899 Other long term (current) drug therapy: Secondary | ICD-10-CM | POA: Diagnosis not present

## 2016-02-07 MED ORDER — PREDNISONE 20 MG PO TABS: 40.0000 mg | ORAL_TABLET | Freq: Every day | ORAL | Status: DC

## 2016-02-07 MED ORDER — ALBUTEROL SULFATE HFA 108 (90 BASE) MCG/ACT IN AERS: 2.0000 | INHALATION_SPRAY | RESPIRATORY_TRACT | Status: DC | PRN

## 2016-02-07 MED ORDER — CETIRIZINE HCL 10 MG PO TABS: 10.0000 mg | ORAL_TABLET | Freq: Every day | ORAL | Status: DC

## 2016-02-07 MED ORDER — ALBUTEROL SULFATE (2.5 MG/3ML) 0.083% IN NEBU: 2.5000 mg | INHALATION_SOLUTION | Freq: Four times a day (QID) | RESPIRATORY_TRACT | Status: DC | PRN

## 2016-02-07 MED ORDER — METHYLPREDNISOLONE SODIUM SUCC 125 MG IJ SOLR
125.0000 mg | Freq: Once | INTRAMUSCULAR | Status: AC
  Administered 2016-02-07: 125 mg via INTRAVENOUS
  Filled 2016-02-07: qty 2

## 2016-02-07 MED ORDER — SODIUM CHLORIDE 0.9 % IV BOLUS (SEPSIS)
500.0000 mL | Freq: Once | INTRAVENOUS | Status: AC
  Administered 2016-02-07: 500 mL via INTRAVENOUS

## 2016-02-07 MED ORDER — MAGNESIUM SULFATE 2 GM/50ML IV SOLN
2.0000 g | INTRAVENOUS | Status: AC
  Administered 2016-02-07: 2 g via INTRAVENOUS
  Filled 2016-02-07: qty 50

## 2016-02-07 MED ORDER — IPRATROPIUM-ALBUTEROL 0.5-2.5 (3) MG/3ML IN SOLN
3.0000 mL | Freq: Once | RESPIRATORY_TRACT | Status: AC
  Administered 2016-02-07: 3 mL via RESPIRATORY_TRACT
  Filled 2016-02-07: qty 3

## 2016-02-07 MED ORDER — ALBUTEROL SULFATE HFA 108 (90 BASE) MCG/ACT IN AERS
2.0000 | INHALATION_SPRAY | Freq: Once | RESPIRATORY_TRACT | Status: AC
  Administered 2016-02-07: 2 via RESPIRATORY_TRACT
  Filled 2016-02-07: qty 6.7

## 2016-02-07 MED ORDER — PREDNISONE 20 MG PO TABS: 60.0000 mg | ORAL_TABLET | Freq: Once | ORAL | Status: DC

## 2016-02-07 MED ORDER — ALBUTEROL (5 MG/ML) CONTINUOUS INHALATION SOLN
10.0000 mg/h | INHALATION_SOLUTION | RESPIRATORY_TRACT | Status: AC
  Administered 2016-02-07: 10 mg/h via RESPIRATORY_TRACT
  Filled 2016-02-07: qty 20

## 2016-02-07 NOTE — ED Notes (Signed)
Bed: WA04 Expected date:  Expected time:  Means of arrival:  Comments: Needs cleaning

## 2016-02-07 NOTE — ED Provider Notes (Signed)
Transfer care from Danelle BerryLeisa Tapia, PA-C. Asthma exacerbation. Given Solu-mderol and magesium sulfate with good relief. Pregnant. No contractions. Hasn't felt baby move. OB evaluating her. Watch for rebound. Reassess at 0700.   0725 Lung clear, no wheezing auscultated. Patient not short of breath, feeling well. Ready for discharge. Patient discharged with Zyrtec, albuterol, prednisone, and Rx for home nebulizer. Patient vitals stable, with some mild tachycardia most likely from albuterol treatment, and in satisfactory condition at discharge.  [address]Myishia Kasik M Heru Montz, PA-C 02/07/16 [phone]  Geoffery Lyonsouglas Delo, MD 02/07/16 [phone]

## 2016-02-07 NOTE — Discharge Instructions (Signed)
Medications: Zyrtec, albuterol inhaler, albuterol nebulizer, prednisone  Treatment: Take Zyrtec daily as prescribed. Take prednisone daily as prescribed. Use albuterol inhaler every 4 hours as needed for shortness of breath or wheezing. For severe shortness of breath or wheezing, use albuterol nebulizer every 6 hours as needed. If these medications are not working to improve your shortness of breath and wheezing, please return to the emergency department immediately.  Follow-up: Please follow-up with your OB/GYN this week or next for follow-up of today's visit and for further evaluation and treatment of your asthma symptoms during pregnancy.   Asthma, Adult Asthma is a recurring condition in which the airways tighten and narrow. Asthma can make it difficult to breathe. It can cause coughing, wheezing, and shortness of breath. Asthma episodes, also called asthma attacks, range from minor to life-threatening. Asthma cannot be cured, but medicines and lifestyle changes can help control it. CAUSES Asthma is believed to be caused by inherited (genetic) and environmental factors, but its exact cause is unknown. Asthma may be triggered by allergens, lung infections, or irritants in the air. Asthma triggers are different for each person. Common triggers include:   Animal dander.  Dust mites.  Cockroaches.  Pollen from trees or grass.  Mold.  Smoke.  Air pollutants such as dust, household cleaners, hair sprays, aerosol sprays, paint fumes, strong chemicals, or strong odors.  Cold air, weather changes, and winds (which increase molds and pollens in the air).  Strong emotional expressions such as crying or laughing hard.  Stress.  Certain medicines (such as aspirin) or types of drugs (such as beta-blockers).  Sulfites in foods and drinks. Foods and drinks that may contain sulfites include dried fruit, potato chips, and sparkling grape juice.  Infections or inflammatory conditions such as the  flu, a cold, or an inflammation of the nasal membranes (rhinitis).  Gastroesophageal reflux disease (GERD).  Exercise or strenuous activity. SYMPTOMS Symptoms may occur immediately after asthma is triggered or many hours later. Symptoms include:  Wheezing.  Excessive nighttime or early morning coughing.  Frequent or severe coughing with a common cold.  Chest tightness.  Shortness of breath. DIAGNOSIS  The diagnosis of asthma is made by a review of your medical history and a physical exam. Tests may also be performed. These may include:  Lung function studies. These tests show how much air you breathe in and out.  Allergy tests.  Imaging tests such as X-rays. TREATMENT  Asthma cannot be cured, but it can usually be controlled. Treatment involves identifying and avoiding your asthma triggers. It also involves medicines. There are 2 classes of medicine used for asthma treatment:   Controller medicines. These prevent asthma symptoms from occurring. They are usually taken every day.  Reliever or rescue medicines. These quickly relieve asthma symptoms. They are used as needed and provide short-term relief. Your health care provider will help you create an asthma action plan. An asthma action plan is a written plan for managing and treating your asthma attacks. It includes a list of your asthma triggers and how they may be avoided. It also includes information on when medicines should be taken and when their dosage should be changed. An action plan may also involve the use of a device called a peak flow meter. A peak flow meter measures how well the lungs are working. It helps you monitor your condition. HOME CARE INSTRUCTIONS   Take medicines only as directed by your health care provider. Speak with your health care provider if you have  questions about how or when to take the medicines.  Use a peak flow meter as directed by your health care provider. Record and keep track of  readings.  Understand and use the action plan to help minimize or stop an asthma attack without needing to seek medical care.  Control your home environment in the following ways to help prevent asthma attacks:  Do not smoke. Avoid being exposed to secondhand smoke.  Change your heating and air conditioning filter regularly.  Limit your use of fireplaces and wood stoves.  Get rid of pests (such as roaches and mice) and their droppings.  Throw away plants if you see mold on them.  Clean your floors and dust regularly. Use unscented cleaning products.  Try to have someone else vacuum for you regularly. Stay out of rooms while they are being vacuumed and for a short while afterward. If you vacuum, use a dust mask from a hardware store, a double-layered or microfilter vacuum cleaner bag, or a vacuum cleaner with a HEPA filter.  Replace carpet with wood, tile, or vinyl flooring. Carpet can trap dander and dust.  Use allergy-proof pillows, mattress covers, and box spring covers.  Wash bed sheets and blankets every week in hot water and dry them in a dryer.  Use blankets that are made of polyester or cotton.  Clean bathrooms and kitchens with bleach. If possible, have someone repaint the walls in these rooms with mold-resistant paint. Keep out of the rooms that are being cleaned and painted.  Wash hands frequently. SEEK MEDICAL CARE IF:   You have wheezing, shortness of breath, or a cough even if taking medicine to prevent attacks.  The colored mucus you cough up (sputum) is thicker than usual.  Your sputum changes from clear or white to yellow, green, gray, or bloody.  You have any problems that may be related to the medicines you are taking (such as a rash, itching, swelling, or trouble breathing).  You are using a reliever medicine more than 2-3 times per week.  Your peak flow is still at 50-79% of your personal best after following your action plan for 1 hour.  You have a  fever. SEEK IMMEDIATE MEDICAL CARE IF:   You seem to be getting worse and are unresponsive to treatment during an asthma attack.  You are short of breath even at rest.  You get short of breath when doing very little physical activity.  You have difficulty eating, drinking, or talking due to asthma symptoms.  You develop chest pain.  You develop a fast heartbeat.  You have a bluish color to your lips or fingernails.  You are light-headed, dizzy, or faint.  Your peak flow is less than 50% of your personal best.   This information is not intended to replace advice given to you by your health care provider. Make sure you discuss any questions you have with your health care provider.   Document Released: 09/23/2005 Document Revised: 06/14/2015 Document Reviewed: 04/22/2013 Elsevier Interactive Patient Education [employer]

## 2016-02-07 NOTE — Progress Notes (Signed)
Patient is a G1P0 @ 1524w6da. FHT 135bpm, moderate variability, no accelerations, no decelerations; no contractions after asthma treatment. Notified Dr. Gaynell FaceMarshall of fetal heart tones. OK to d/c monitoring.

## 2016-02-07 NOTE — ED Notes (Signed)
RT in room.

## 2016-02-07 NOTE — ED Notes (Signed)
Pt is 24 weeks prego.

## 2016-02-07 NOTE — ED Notes (Signed)
Notified [facility], Olegario MessierKathy RN will be here within 30 minutes

## 2016-02-07 NOTE — ED Notes (Signed)
[facility] OB nurse cleared pt and left. Waiting d/c instructions or more orders

## 2016-02-07 NOTE — ED Provider Notes (Signed)
CSN: [id number]     Arrival date & time 02/07/16  0214 History   First MD Initiated Contact with Patient 02/07/16 [phone]     Chief Complaint  Patient presents with  . Asthma     (Consider location/radiation/quality/duration/timing/severity/associated sxs/prior Treatment) HPI    Pt 20 y/o female, [redacted] weeks pregnant, presents to the ER with asthma exacerbation for the past 5 days that has gradually worsened secondary to running out of medications.  She recently had URI sx and her seasonal allergies have worsened, but she has not taken any medications for it.  She began feeling SOB and wheezy 5 days ago.  She came to the ER yesterday, and received a breathing treatment in triage, and felt much better and left after feeling significant improvement.  She also did not want to wait any longer to be seen, and stated at the time of leaving that she would see her OB and ask for an inhaler refill.  She did go to her OB, however there was a problem at the pharmacy and she was told they could not fill it.  She again felt very SOB, wheezy and tight, so she came back to the ER for eval.  She denies productive sputum, fevers, chills, sweats, CP, LE edema, palpitations.  She denies contractions, vaginal discharge.  She has not felt much fetal movement.   She has a hx of admissions for asthma exacerbation.  She does not believe she has ever been intubated before. No other complaints.  Past Medical History  Diagnosis Date  . Asthma    History reviewed. No pertinent past surgical history. History reviewed. No pertinent family history. Social History  Substance Use Topics  . Smoking status: Never Smoker   . Smokeless tobacco: None  . Alcohol Use: No   OB History    Gravida Para Term Preterm AB TAB SAB Ectopic Multiple Living       Review of Systems  All other systems reviewed and are negative.     Allergies  Penicillins and Eggs or egg-derived products  Home Medications   Prior  to Admission medications   Medication Sig Start Date End Date Taking? Authorizing Provider  ferrous sulfate 325 (65 FE) MG tablet Take 1 tablet by mouth daily. Reported on 02/07/2016 01/09/16  Yes Historical Provider, MD  Prenatal Vit-Fe Fumarate-FA (PRENATAL MULTIVITAMIN) TABS tablet Take 1 tablet by mouth daily at 12 noon.   Yes Historical Provider, MD  albuterol (PROVENTIL HFA;VENTOLIN HFA) 108 (90 Base) MCG/ACT inhaler Inhale 2 puffs into the lungs every 4 (four) hours as needed for wheezing or shortness of breath. 02/07/16   Danelle Berry, PA-C  albuterol (PROVENTIL) (2.5 MG/3ML) 0.083% nebulizer solution Take 3 mLs (2.5 mg total) by nebulization every 6 (six) hours as needed for wheezing or shortness of breath. 02/07/16   Danelle Berry, PA-C  cetirizine (ZYRTEC ALLERGY) 10 MG tablet Take 1 tablet (10 mg total) by mouth daily. 02/07/16   Danelle Berry, PA-C  predniSONE (DELTASONE) 20 MG tablet Take 2 tablets (40 mg total) by mouth daily. Take 40 mg by mouth daily for 3 days, then  by mouth daily for 3 days, then  daily for 3 days 02/07/16   Danelle Berry, PA-C   BP 123/67 mmHg  Pulse 69  Temp(Src) 98.1 F (36.7 C) (Oral)  Resp 18  SpO2 100%  LMP 07/08/2015 Physical Exam  Constitutional: She is oriented to person, place, and time.  She appears well-developed and well-nourished. No distress.  HENT:  Head: Normocephalic and atraumatic.  Nose: Nose normal.  Mouth/Throat: Oropharynx is clear and moist. No oropharyngeal exudate.  Eyes: Conjunctivae and EOM are normal. Pupils are equal, round, and reactive to light. Right eye exhibits no discharge. Left eye exhibits no discharge. No scleral icterus.  Neck: Normal range of motion. No JVD present. No tracheal deviation present. No thyromegaly present.  Cardiovascular: Normal rate, regular rhythm, normal heart sounds and intact distal pulses.  Exam reveals no gallop and no friction rub.   No murmur heard. No LE edema  Pulmonary/Chest: Effort normal.  Tachypnea noted. No respiratory distress. She has decreased breath sounds. She has wheezes. She has no rales. She exhibits no tenderness.  Diffuse prolonged expiratory wheeze in all lung fields, tachypnea, no rales or rhonchi, no chest tenderness  Abdominal: There is no tenderness.  Fundal height 4 cm above umbilicus, no ttp  Musculoskeletal: Normal range of motion. She exhibits no edema or tenderness.  Lymphadenopathy:    She has no cervical adenopathy.  Neurological: She is alert and oriented to person, place, and time. She has normal reflexes. No cranial nerve deficit. She exhibits normal muscle tone. Coordination normal.  Skin: Skin is warm and dry. No rash noted. She is not diaphoretic. No cyanosis or erythema. No pallor. Nails show no clubbing.  Psychiatric: She has a normal mood and affect. Her behavior is normal. Judgment and thought content normal.  Nursing note and vitals reviewed.   ED Course  Procedures (including critical care time) Labs Review Labs Reviewed - No data to display  Imaging Review No results found. I have personally reviewed and evaluated these images and lab results as part of my medical decision-making.   EKG Interpretation None      MDM   Patient is a 20 year old female, [redacted] weeks pregnant, presents for asthma exacerbation that is worsened over the past 2 days. She was seen in the ER yesterday, received a breathing treatment but left before being seen by a provider.  She stated at the time of leaving that she was given a see her OB, she has been out of her inhaler medications and not on any control medications. She states that she could not get an inhaler filled because there were no instructions on the Rx.    Patient's wheeze was diffuse, but she was mildly tachypneic.  No desaturations on monitor She received a DuoNeb without any change for her breathing, RT suggested CAT She was placed on CAT and IV solumedrol and mag was ordered  5:43 AM Patient  was removed from CAT around 5 AM, breathing has significantly improved, she appears much more comfortable, decrease in wheeze. Rapid OB is at the bedside obtaining nonstress test.  6:27 AM  Pt handed off to Glenford BayleyAlex Law PA-C for re-eval.  OB sign off still pending.   Final diagnoses:  Asthma exacerbation attacks, mild persistent        Danelle BerryLeisa Yuleni Burich, PA-C 02/08/16 1939  Geoffery Lyonsouglas Delo, MD 02/08/16 2259

## 2016-02-07 NOTE — ED Notes (Signed)
Pt states she has asthma and it has been bothering her for the past 2 days  Pt states she came yesterday but left because the wait was too long

## 2016-02-25 ENCOUNTER — Inpatient Hospital Stay (HOSPITAL_COMMUNITY): Payer: BLUE CROSS/BLUE SHIELD

## 2016-02-25 ENCOUNTER — Encounter (HOSPITAL_COMMUNITY): Payer: Self-pay | Admitting: *Deleted

## 2016-02-25 ENCOUNTER — Observation Stay (HOSPITAL_COMMUNITY)
Admission: AD | Admit: 2016-02-25 | Discharge: 2016-02-27 | Disposition: A | Discharge status: Home / Self Care | Payer: BLUE CROSS/BLUE SHIELD | Source: Ambulatory Visit | Attending: Obstetrics | Admitting: Obstetrics

## 2016-02-25 DIAGNOSIS — O99512 Diseases of the respiratory system complicating pregnancy, second trimester: Secondary | ICD-10-CM | POA: Diagnosis not present

## 2016-02-25 DIAGNOSIS — Z3A27 27 weeks gestation of pregnancy: Secondary | ICD-10-CM | POA: Insufficient documentation

## 2016-02-25 DIAGNOSIS — O4690 Antepartum hemorrhage, unspecified, unspecified trimester: Secondary | ICD-10-CM | POA: Diagnosis present

## 2016-02-25 DIAGNOSIS — O4702 False labor before 37 completed weeks of gestation, second trimester: Secondary | ICD-10-CM

## 2016-02-25 DIAGNOSIS — O360920 Maternal care for other rhesus isoimmunization, second trimester, not applicable or unspecified: Secondary | ICD-10-CM | POA: Insufficient documentation

## 2016-02-25 DIAGNOSIS — Z91012 Allergy to eggs: Secondary | ICD-10-CM | POA: Diagnosis not present

## 2016-02-25 DIAGNOSIS — J45909 Unspecified asthma, uncomplicated: Secondary | ICD-10-CM | POA: Insufficient documentation

## 2016-02-25 DIAGNOSIS — Z880 Allergy status to penicillin: Secondary | ICD-10-CM | POA: Diagnosis not present

## 2016-02-25 DIAGNOSIS — O26872 Cervical shortening, second trimester: Principal | ICD-10-CM | POA: Insufficient documentation

## 2016-02-25 DIAGNOSIS — O4692 Antepartum hemorrhage, unspecified, second trimester: Secondary | ICD-10-CM | POA: Diagnosis present

## 2016-02-25 LAB — URINALYSIS, ROUTINE W REFLEX MICROSCOPIC
Bilirubin Urine: NEGATIVE
GLUCOSE, UA: NEGATIVE mg/dL
KETONES UR: NEGATIVE mg/dL
Nitrite: NEGATIVE
PH: 7 (ref 5.0–8.0)
PROTEIN: NEGATIVE mg/dL
Specific Gravity, Urine: 1.01 (ref 1.005–1.030)

## 2016-02-25 LAB — CBC
HEMATOCRIT: 30.2 % — AB (ref 36.0–46.0)
HEMOGLOBIN: 10.3 g/dL — AB (ref 12.0–15.0)
MCH: 29 pg (ref 26.0–34.0)
MCHC: 34.1 g/dL (ref 30.0–36.0)
MCV: 85.1 fL (ref 78.0–100.0)
Platelets: 220 10*3/uL (ref 150–400)
RBC: 3.55 MIL/uL — ABNORMAL LOW (ref 3.87–5.11)
RDW: 14.4 % (ref 11.5–15.5)
WBC: 7.7 10*3/uL (ref 4.0–10.5)

## 2016-02-25 LAB — WET PREP, GENITAL
SPERM: NONE SEEN
TRICH WET PREP: NONE SEEN
YEAST WET PREP: NONE SEEN

## 2016-02-25 LAB — URINE MICROSCOPIC-ADD ON

## 2016-02-25 LAB — ABO/RH: ABO/RH(D): A NEG

## 2016-02-25 MED ORDER — PROGESTERONE MICRONIZED 200 MG PO CAPS
200.0000 mg | ORAL_CAPSULE | Freq: Every day | ORAL | Status: DC
  Administered 2016-02-25 – 2016-02-26 (×2): 200 mg via VAGINAL
  Filled 2016-02-25 (×2): qty 1

## 2016-02-25 MED ORDER — PRENATAL MULTIVITAMIN CH
1.0000 | ORAL_TABLET | Freq: Every day | ORAL | Status: DC
  Administered 2016-02-25 – 2016-02-26 (×2): 1 via ORAL
  Filled 2016-02-25 (×2): qty 1

## 2016-02-25 MED ORDER — LACTATED RINGERS IV SOLN
INTRAVENOUS | Status: DC
  Administered 2016-02-25 – 2016-02-26 (×3): via INTRAVENOUS

## 2016-02-25 MED ORDER — FERROUS SULFATE 325 (65 FE) MG PO TABS
325.0000 mg | ORAL_TABLET | Freq: Every day | ORAL | Status: DC
  Administered 2016-02-25 – 2016-02-27 (×3): 325 mg via ORAL
  Filled 2016-02-25 (×3): qty 1

## 2016-02-25 MED ORDER — BETAMETHASONE SOD PHOS & ACET 6 (3-3) MG/ML IJ SUSP
12.0000 mg | Freq: Once | INTRAMUSCULAR | Status: AC
  Administered 2016-02-25: 12 mg via INTRAMUSCULAR
  Filled 2016-02-25: qty 2

## 2016-02-25 MED ORDER — NIFEDIPINE 10 MG PO CAPS
10.0000 mg | ORAL_CAPSULE | Freq: Four times a day (QID) | ORAL | Status: DC
  Administered 2016-02-25 – 2016-02-27 (×8): 10 mg via ORAL
  Filled 2016-02-25 (×8): qty 1

## 2016-02-25 MED ORDER — LACTATED RINGERS IV BOLUS (SEPSIS)
1000.0000 mL | Freq: Once | INTRAVENOUS | Status: AC
  Administered 2016-02-25: 1000 mL via INTRAVENOUS

## 2016-02-25 MED ORDER — RHO D IMMUNE GLOBULIN 1500 UNIT/2ML IJ SOSY
300.0000 ug | PREFILLED_SYRINGE | Freq: Once | INTRAMUSCULAR | Status: AC
  Administered 2016-02-25: 300 ug via INTRAMUSCULAR
  Filled 2016-02-25: qty 2

## 2016-02-25 MED ORDER — DOCUSATE SODIUM 100 MG PO CAPS
100.0000 mg | ORAL_CAPSULE | Freq: Every day | ORAL | Status: DC
  Administered 2016-02-25: 100 mg via ORAL
  Filled 2016-02-25 (×2): qty 1

## 2016-02-25 MED ORDER — NIFEDIPINE 10 MG PO CAPS
10.0000 mg | ORAL_CAPSULE | ORAL | Status: DC | PRN
  Administered 2016-02-25 (×2): 10 mg via ORAL
  Filled 2016-02-25 (×2): qty 1

## 2016-02-25 MED ORDER — CALCIUM CARBONATE ANTACID 500 MG PO CHEW: 2.0000 | CHEWABLE_TABLET | ORAL | Status: DC | PRN

## 2016-02-25 MED ORDER — ACETAMINOPHEN 325 MG PO TABS: 650.0000 mg | ORAL_TABLET | ORAL | Status: DC | PRN

## 2016-02-25 MED ORDER — ALBUTEROL SULFATE (2.5 MG/3ML) 0.083% IN NEBU: 2.5000 mg | INHALATION_SOLUTION | Freq: Four times a day (QID) | RESPIRATORY_TRACT | Status: DC | PRN

## 2016-02-25 MED ORDER — ZOLPIDEM TARTRATE 5 MG PO TABS: 5.0000 mg | ORAL_TABLET | Freq: Every evening | ORAL | Status: DC | PRN

## 2016-02-25 MED ORDER — BETAMETHASONE SOD PHOS & ACET 6 (3-3) MG/ML IJ SUSP
12.0000 mg | Freq: Once | INTRAMUSCULAR | Status: AC
  Administered 2016-02-26: 12 mg via INTRAMUSCULAR
  Filled 2016-02-25: qty 2

## 2016-02-25 NOTE — MAU Provider Note (Signed)
History     CSN: [id number]  Arrival date and time: 02/25/16 [id number]   First Provider Initiated Contact with Patient 02/25/16 [phone]      Chief Complaint  Patient presents with  . Vaginal Bleeding   HPI [name] is a 20 y.o. G1P0000 at 5953w3d who presents to MAU today with complaint of vaginal bleeding after intercourse this morning. She states that bleeding was similar to a light period, although she is not wearing a pad upon arrival. She states LOF x 3 weeks, that has been assessed in the office. She was told there was nothing wrong. She denies abdominal pain, contractions or complications with the pregnancy. She reports normal fetal movement.   OB History    Gravida Para Term Preterm AB TAB SAB Ectopic Multiple Living   1 0 0 0 0 0 0 0 0 0       Past Medical History  Diagnosis Date  . Asthma     History reviewed. No pertinent past surgical history.  History reviewed. No pertinent family history.  Social History  Substance Use Topics  . Smoking status: Never Smoker   . Smokeless tobacco: None  . Alcohol Use: No    Allergies:  Allergies  Allergen Reactions  . Penicillins Anaphylaxis    Has patient had a PCN reaction causing immediate rash, facial/tongue/throat swelling, SOB or lightheadedness with hypotension: No Has patient had a PCN reaction causing severe rash involving mucus membranes or skin necrosis: Yes Has patient had a PCN reaction that required hospitalization Yes  If all of the above answers are "NO", then may proceed with Cephalosporin use.    . Eggs Or Egg-Derived Products Nausea And Vomiting    Prescriptions prior to admission  Medication Sig Dispense Refill Last Dose  . albuterol (PROVENTIL HFA;VENTOLIN HFA) 108 (90 Base) MCG/ACT inhaler Inhale 2 puffs into the lungs every 4 (four) hours as needed for wheezing or shortness of breath. 1 Inhaler 0 02/24/2016 at Unknown time  . albuterol (PROVENTIL) (2.5 MG/3ML) 0.083% nebulizer solution Take 3  mLs (2.5 mg total) by nebulization every 6 (six) hours as needed for wheezing or shortness of breath. 75 mL 0 02/24/2016 at Unknown time  . cetirizine (ZYRTEC ALLERGY) 10 MG tablet Take 1 tablet (10 mg total) by mouth daily. 30 tablet 1 02/24/2016 at Unknown time  . ferrous sulfate 325 (65 FE) MG tablet Take 1 tablet by mouth daily. Reported on 02/07/2016  6 02/25/2016 at Unknown time  . predniSONE (DELTASONE) 20 MG tablet Take 2 tablets (40 mg total) by mouth daily. Take 40 mg by mouth daily for 3 days, then 20mg  by mouth daily for 3 days, then 10mg  daily for 3 days 12 tablet 0 02/09/2016 at Unknown time  . Prenatal Vit-Fe Fumarate-FA (PRENATAL MULTIVITAMIN) TABS tablet Take 1 tablet by mouth daily at 12 noon.   02/25/2016 at Unknown time    Review of Systems  Constitutional: Negative for fever and malaise/fatigue.  Gastrointestinal: Negative for abdominal pain.  Genitourinary:       + vaginal bleeding ? LOF   Physical Exam   Last menstrual period 07/08/2015.  Physical Exam  Nursing note and vitals reviewed. Constitutional: She is oriented to person, place, and time. She appears well-developed and well-nourished. No distress.  HENT:  Head: Normocephalic and atraumatic.  Cardiovascular: Normal rate.   Respiratory: Effort normal.  GI: Soft. She exhibits no distension and no mass. There is no tenderness. There is no rebound and no guarding.  Genitourinary: Uterus is enlarged. Cervix exhibits no motion tenderness, no discharge and no friability. There is bleeding (small amount of blood with 1 small clot in the vaginal vault) in the vagina. No vaginal discharge found.  Neurological: She is alert and oriented to person, place, and time.  Skin: Skin is warm and dry. No erythema.  Psychiatric: She has a normal mood and affect.  Dilation: Fingertip Effacement (%): 50 Cervical Position: Posterior Exam by:: Harlon Flor, PA-C  Results for orders placed or performed during the hospital encounter of  02/25/16 (from the past 24 hour(s))  CBC     Status: Abnormal   Collection Time: 02/25/16  6:55 AM  Result Value Ref Range   WBC 7.7 4.0 - 10.5 K/uL   RBC 3.55 (L) 3.87 - 5.11 MIL/uL   Hemoglobin 10.3 (L) 12.0 - 15.0 g/dL   HCT 46.9 (L) 62.9 - 52.8 %   MCV 85.1 78.0 - 100.0 fL   MCH 29.0 26.0 - 34.0 pg   MCHC 34.1 30.0 - 36.0 g/dL   RDW 41.3 24.4 - 01.0 %   Platelets 220 150 - 400 K/uL  ABO/Rh     Status: None (Preliminary result)   Collection Time: 02/25/16  6:55 AM  Result Value Ref Range   ABO/RH(D) A NEG     Fetal Monitoring: Baseline: 130 bpm Variability: moderate Accelerations: 10 x 10 Decelerations: none Contractions: moderate UI, then contractions q 5-7 minutes  MAU Course  Procedures None  MDM US OB limited ordered prior to exam due to bleeding Preliminary report shows cephalic presentation, subjectively normal AFI, placenta without previa and cervical length is 2.0 cm with funneling of 1.8 cm and 1.4 cm width Fern - negative Discussed patient with Dr. Clearance Coots. Advised pelvic rest and follow-up in the office this week with Dr. Gaynell Face.  ABO/Rh and CBC ordered prior to discharge due to bleeding Patient had not been on EFM/TOCO long enough for NST, monitors replaced after [place] and exam. Patient appears to be having contractions q 5-7 minutes Updated Dr. Clearance Coots on contractions noted. Recommends IV LR bolus and Procardia. Patient now endorses mild tightening intermittently.  Blood type A negative. Rhogam work-up and Rhogam ordered.  2725 - Care turned over to [place], CNM. Patient receiving IV fluids and EFM with TOCO  Minimal contractions after second dose of Procardia and IV fluids. No cervical change, scant bleeding. After talking to patient more about her contractions she realizes that she's been having frequent contractions for 3 or 4 days. Reviewed history, exam and ultrasound again with Dr. Clearance Coots. Recommends 23 hours observation, Procardia, betamethasone and  vaginal progesterone. Discussed with Dr. Otho Perl, MFM who agrees with plan of care as well.  Marny Lowenstein, PA-C  02/25/2016, 8:21 AM  Assessment and Plan   1. Short cervix in second trimester, antepartum   2. Vaginal bleeding during pregnancy, antepartum   3. Preterm contractions, second trimester   4.      Rh- antepartum  Plan: 23 hour observation on antenatal Procardia 10 mg every 6 Vaginal progesterone daily at bedtime First dose of betamethasone given. Second dose due in 24 hours. Wet Prep, GC/Chlamydia, UA pending   [place], CNM 02/25/2016 10:23 AM

## 2016-02-25 NOTE — MAU Note (Signed)
Pt states she had bright red bleeding (as heavy as a period) about 30 minutes ago after intercourse. Denies any pain or LOF. +FM.

## 2016-02-25 NOTE — H&P (Signed)
History     CSN: [id number]  Arrival date and time: 02/25/16 4098   First Provider Initiated Contact with Patient 02/25/16 [phone]      Chief Complaint  Patient presents with  . Vaginal Bleeding   HPI [name] is a 20 y.o. G1P0000 at [redacted]w[redacted]d who presents to MAU today with complaint of vaginal bleeding after intercourse this morning. She states that bleeding was similar to a light period, although she is not wearing a pad upon arrival. She states LOF x 3 weeks, that has been assessed in the office. She was told there was nothing wrong. She denies abdominal pain, contractions or complications with the pregnancy. She reports normal fetal movement.   OB History    Gravida Para Term Preterm AB TAB SAB Ectopic Multiple Living        Past Medical History  Diagnosis Date  . Asthma     History reviewed. No pertinent past surgical history.  History reviewed. No pertinent family history.  Social History  Substance Use Topics  . Smoking status: Never Smoker   . Smokeless tobacco: None  . Alcohol Use: No    Allergies:  Allergies  Allergen Reactions  . Penicillins Anaphylaxis    Has patient had a PCN reaction causing immediate rash, facial/tongue/throat swelling, SOB or lightheadedness with hypotension: No Has patient had a PCN reaction causing severe rash involving mucus membranes or skin necrosis: Yes Has patient had a PCN reaction that required hospitalization Yes  If all of the above answers are "NO", then may proceed with Cephalosporin use.    . Eggs Or Egg-Derived Products Nausea And Vomiting    Prescriptions prior to admission  Medication Sig Dispense Refill Last Dose  . albuterol (PROVENTIL HFA;VENTOLIN HFA) 108 (90 Base) MCG/ACT inhaler Inhale 2 puffs into the lungs every 4 (four) hours as needed for wheezing or shortness of breath. 1 Inhaler 0 02/24/2016 at Unknown time  . albuterol (PROVENTIL) (2.5 MG/3ML) 0.083% nebulizer solution Take 3  mLs (2.5 mg total) by nebulization every 6 (six) hours as needed for wheezing or shortness of breath. 75 mL 0 02/24/2016 at Unknown time  . cetirizine (ZYRTEC ALLERGY) 10 MG tablet Take 1 tablet (10 mg total) by mouth daily. 30 tablet 1 02/24/2016 at Unknown time  . ferrous sulfate 325 (65 FE) MG tablet Take 1 tablet by mouth daily. Reported on 02/07/2016  6 02/25/2016 at Unknown time  . predniSONE (DELTASONE) 20 MG tablet Take 2 tablets (40 mg total) by mouth daily. Take 40 mg by mouth daily for 3 days, then  by mouth daily for 3 days, then  daily for 3 days 12 tablet 0 02/09/2016 at Unknown time  . Prenatal Vit-Fe Fumarate-FA (PRENATAL MULTIVITAMIN) TABS tablet Take 1 tablet by mouth daily at 12 noon.   02/25/2016 at Unknown time    Review of Systems  Constitutional: Negative for fever and malaise/fatigue.  Gastrointestinal: Negative for abdominal pain.  Genitourinary:       + vaginal bleeding ? LOF   Physical Exam   Last menstrual period 07/08/2015.  Physical Exam  Nursing note and vitals reviewed. Constitutional: She is oriented to person, place, and time. She appears well-developed and well-nourished. No distress.  HENT:  Head: Normocephalic and atraumatic.  Cardiovascular: Normal rate.   Respiratory: Effort normal.  GI: Soft. She exhibits no distension and no mass. There is no tenderness. There is no rebound and no guarding.  Genitourinary: Uterus is enlarged. Cervix exhibits no motion tenderness, no discharge and no friability. There is bleeding (small amount of blood with 1 small clot in the vaginal vault) in the vagina. No vaginal discharge found.  Neurological: She is alert and oriented to person, place, and time.  Skin: Skin is warm and dry. No erythema.  Psychiatric: She has a normal mood and affect.  Dilation: Fingertip Effacement (%): 50 Cervical Position: Posterior Exam by:: Harlon FlorJ. Wenzel, PA-C  Results for orders placed or performed during the hospital encounter of  02/25/16 (from the past 24 hour(s))  CBC     Status: Abnormal   Collection Time: 02/25/16  6:55 AM  Result Value Ref Range   WBC 7.7 4.0 - 10.5 K/uL   RBC 3.55 (L) 3.87 - 5.11 MIL/uL   Hemoglobin 10.3 (L) 12.0 - 15.0 g/dL   HCT 87.530.2 (L) 64.336.0 - 32.946.0 %   MCV 85.1 78.0 - 100.0 fL   MCH 29.0 26.0 - 34.0 pg   MCHC 34.1 30.0 - 36.0 g/dL   RDW 51.814.4 84.111.5 - 66.015.5 %   Platelets 220 150 - 400 K/uL  ABO/Rh     Status: None (Preliminary result)   Collection Time: 02/25/16  6:55 AM  Result Value Ref Range   ABO/RH(D) A NEG     Fetal Monitoring: Baseline: 130 bpm Variability: moderate Accelerations: 10 x 10 Decelerations: none Contractions: moderate UI, then contractions q 5-7 minutes  MAU Course  Procedures None  MDM US OB limited ordered prior to exam due to bleeding Preliminary report shows cephalic presentation, subjectively normal AFI, placenta without previa and cervical length is 2.0 cm with funneling of 1.8 cm and 1.4 cm width Fern - negative  ABO/Rh and CBC ordered prior to discharge due to bleeding Patient had not been on EFM/TOCO long enough for NST, monitors replaced after US and exam. Patient appears to be having contractions q 5-7 minutes                                                                  Patient now endorses mild tightening intermittently.  Blood type A negative. Rhogam work-up and Rhogam ordered.  Minimal contractions after second dose of Procardia and IV fluids. No cervical change, scant bleeding. After talking to patient more about her contractions she realizes that she's been having frequent contractions for 3 or 4 days. Recommend 23 hours observation, Procardia, betamethasone and vaginal progesterone. Discussed with Dr. Otho PerlNitsche, MFM who agrees with plan of care.  Assessment and Plan   1. Short cervix in second trimester, antepartum   2. Vaginal bleeding during pregnancy, antepartum   3. Preterm contractions, second trimester   4.      Rh-  antepartum  Plan: 23 hour observation on antenatal Procardia 10 mg every 6 Vaginal progesterone daily at bedtime First dose of betamethasone given. Second dose due in 24 hours. Wet Prep, GC/Chlamydia, UA pending

## 2016-02-26 DIAGNOSIS — O26872 Cervical shortening, second trimester: Secondary | ICD-10-CM | POA: Diagnosis not present

## 2016-02-26 LAB — RH IG WORKUP (INCLUDES ABO/RH)
ABO/RH(D): A NEG
ANTIBODY SCREEN: NEGATIVE
FETAL SCREEN: NEGATIVE
Gestational Age(Wks): 27
UNIT DIVISION: 0

## 2016-02-26 LAB — GC/CHLAMYDIA PROBE AMP (~~LOC~~) NOT AT ARMC
Chlamydia: NEGATIVE
NEISSERIA GONORRHEA: NEGATIVE

## 2016-02-26 NOTE — Progress Notes (Signed)
Patient ID: Jodi Roth, female   DOB: 01/28/1996, 20 y.o.   MRN: [id number] Blood pressure 115/57 pulse 69 No more bleeding since admission she has had some irritability and she is on Procardia 10 mg twice a day she got betamethasone and is awaiting the second dose6

## 2016-02-27 DIAGNOSIS — O26872 Cervical shortening, second trimester: Secondary | ICD-10-CM | POA: Diagnosis not present

## 2016-02-27 NOTE — Discharge Summary (Signed)
  Patient is a primigravida at 27 weeks 5 days was admitted with some bleeding after sex and irritability on admission ultrasound negative and she was placed on Procardia 10 mg every 6 hours and Prometrium 200 intravaginally she has not had any irritability she also got betamethasone 2 and is discharged today to see me in 2 weeks

## 2016-02-27 NOTE — Progress Notes (Signed)
I offered support to Jodi Roth as she was preparing to be discharged.  She stated that she was doing fine and that she has support from her mother,who is coming to pick her up shortly. She did not state any particular needs at this time, though she is aware of our ongoing availability for spiritual and emotional support.  Chaplain Dyanne CarrelKaty Mitali [place]ne[place], Bcc Pager, [phone] 10:06 AM    02/27/16 1000  Clinical Encounter Type  Visited With Patient  Visit Type Initial

## 2016-02-27 NOTE — Discharge Instructions (Signed)
Pelvic Rest Pelvic rest is sometimes recommended for women when:  1. The placenta is partially or completely covering the opening of the cervix (placenta previa). 2. There is bleeding between the uterine wall and the amniotic sac in the first trimester (subchorionic hemorrhage). 3. The cervix begins to open without labor starting (incompetent cervix, cervical insufficiency). 4. The labor is too early (preterm labor). HOME CARE INSTRUCTIONS 1. Do not have sexual intercourse, stimulation, or an orgasm. 2. Do not use tampons, douche, or put anything in the vagina. 3. Do not lift anything over 10 pounds (4.5 kg). 4. Avoid strenuous activity or straining your pelvic muscles. SEEK MEDICAL CARE IF:  You have any vaginal bleeding during pregnancy. Treat this as a potential emergency.  You have cramping pain felt low in the stomach (stronger than menstrual cramps).  You notice vaginal discharge (watery, mucus, or bloody).  You have a low, dull backache.  There are regular contractions or uterine tightening. SEEK IMMEDIATE MEDICAL CARE IF: You have vaginal bleeding and have placenta previa.    This information is not intended to replace advice given to you by your health care provider. Make sure you discuss any questions you have with your health care provider.   Document Released: 01/18/2011 Document Revised: 12/16/2011 Document Reviewed: 03/27/2015 Elsevier Interactive Patient Education 2016 [employer]. Preterm Labor Information Preterm labor is when labor starts before you are [redacted] weeks pregnant. The normal length of pregnancy is 39 to 41 weeks.  CAUSES  The cause of preterm labor is not often known. The most common known cause is infection. RISK FACTORS 5. Having a history of preterm labor. 6. Having your water break before it should. 7. Having a placenta that covers the opening of the cervix. 8. Having a placenta that breaks away from the uterus. 9. Having a cervix that is too  weak to hold the baby in the uterus. 10. Having too much fluid in the amniotic sac. 11. Taking drugs or smoking while pregnant. 12. Not gaining enough weight while pregnant. 13. Being younger than 56 and older than 20 years old. 14. Having a low income. 15. Being African American. SYMPTOMS 5. Period-like cramps, belly (abdominal) pain, or back pain. 6. Contractions that are regular, as often as six in an hour. They may be mild or painful. 7. Contractions that start at the top of the belly. They then move to the lower belly and back. 8. Lower belly pressure that seems to get stronger. 9. Bleeding from the vagina. 10. Fluid leaking from the vagina. TREATMENT  Treatment depends on:  Your condition.  The condition of your baby.  How many weeks pregnant you are. Your doctor may have you:  Take medicine to stop contractions.  Stay in bed except to use the restroom (bed rest).  Stay in the hospital. WHAT SHOULD YOU DO IF YOU THINK YOU ARE IN PRETERM LABOR? Call your doctor right away. You need to go to the hospital right away.  HOW CAN YOU PREVENT PRETERM LABOR IN FUTURE PREGNANCIES?  Stop smoking, if you smoke.  Maintain healthy weight gain.  Do not take drugs or be around chemicals that are not needed.  Tell your doctor if you think you have an infection.  Tell your doctor if you had a preterm labor before.   This information is not intended to replace advice given to you by your health care provider. Make sure you discuss any questions you have with your health care provider.   Document  Released: 12/20/2008 Document Revised: 02/07/2015 Document Reviewed: 10/26/2012 Elsevier Interactive Patient Education 2016 [employer] Discharge instructions   You can wash your hair  Shower  Eat what you want  Drink what you want  See me in 6 weeks  Your ankles are going to swell more in the next 2 weeks than when pregnant  No sex for 6 weeks   Mirra Basilio A, MD  02/27/2016

## 2016-02-27 NOTE — Progress Notes (Signed)
Patient ID: Jodi Roth, female   DOB: [birth year], 20 y.o.   MRN: [id number] Blood pressure 108/47 Patient has not had any problems with size contractions concern she been discharged today on Procardia 10 mg by mouth every 6 hours and Prometrium 200 intravaginally to see me in 2 weeks

## 2016-02-29 LAB — TYPE AND SCREEN
ABO/RH(D): A NEG
ANTIBODY SCREEN: POSITIVE
DAT, IgG: NEGATIVE
UNIT DIVISION: 0
Unit division: 0

## 2016-04-10 ENCOUNTER — Inpatient Hospital Stay (HOSPITAL_COMMUNITY)
Admission: AD | Admit: 2016-04-10 | Discharge: 2016-04-10 | Disposition: A | Discharge status: Home / Self Care | Payer: BLUE CROSS/BLUE SHIELD | Source: Ambulatory Visit | Attending: Obstetrics & Gynecology | Admitting: Obstetrics & Gynecology

## 2016-04-10 ENCOUNTER — Encounter (HOSPITAL_COMMUNITY): Payer: Self-pay | Admitting: *Deleted

## 2016-04-10 DIAGNOSIS — Z91012 Allergy to eggs: Secondary | ICD-10-CM | POA: Diagnosis not present

## 2016-04-10 DIAGNOSIS — O4703 False labor before 37 completed weeks of gestation, third trimester: Secondary | ICD-10-CM

## 2016-04-10 DIAGNOSIS — O368130 Decreased fetal movements, third trimester, not applicable or unspecified: Secondary | ICD-10-CM | POA: Diagnosis present

## 2016-04-10 DIAGNOSIS — Z79899 Other long term (current) drug therapy: Secondary | ICD-10-CM | POA: Diagnosis not present

## 2016-04-10 DIAGNOSIS — Z880 Allergy status to penicillin: Secondary | ICD-10-CM | POA: Diagnosis not present

## 2016-04-10 DIAGNOSIS — Z7952 Long term (current) use of systemic steroids: Secondary | ICD-10-CM | POA: Insufficient documentation

## 2016-04-10 DIAGNOSIS — J45909 Unspecified asthma, uncomplicated: Secondary | ICD-10-CM | POA: Insufficient documentation

## 2016-04-10 DIAGNOSIS — Z3689 Encounter for other specified antenatal screening: Secondary | ICD-10-CM

## 2016-04-10 DIAGNOSIS — O479 False labor, unspecified: Secondary | ICD-10-CM | POA: Diagnosis not present

## 2016-04-10 DIAGNOSIS — Z3A00 Weeks of gestation of pregnancy not specified: Secondary | ICD-10-CM | POA: Insufficient documentation

## 2016-04-10 LAB — URINALYSIS, ROUTINE W REFLEX MICROSCOPIC
BILIRUBIN URINE: NEGATIVE
GLUCOSE, UA: NEGATIVE mg/dL
HGB URINE DIPSTICK: NEGATIVE
Ketones, ur: NEGATIVE mg/dL
Nitrite: NEGATIVE
Protein, ur: NEGATIVE mg/dL
SPECIFIC GRAVITY, URINE: 1.02 (ref 1.005–1.030)
pH: 5.5 (ref 5.0–8.0)

## 2016-04-10 LAB — FETAL FIBRONECTIN: FETAL FIBRONECTIN: POSITIVE — AB

## 2016-04-10 LAB — URINE MICROSCOPIC-ADD ON: RBC / HPF: NONE SEEN RBC/hpf (ref 0–5)

## 2016-04-10 MED ORDER — BETAMETHASONE SOD PHOS & ACET 6 (3-3) MG/ML IJ SUSP
12.0000 mg | Freq: Once | INTRAMUSCULAR | Status: AC
  Administered 2016-04-10: 12 mg via INTRAMUSCULAR
  Filled 2016-04-10: qty 2

## 2016-04-10 NOTE — MAU Note (Signed)
uRINE IN LAB

## 2016-04-10 NOTE — MAU Provider Note (Signed)
History   [id number]   Chief Complaint  Patient presents with  . Decreased Fetal Movement    HPI Jodi Roth is a 20 y.o. female  G1P0000 here with report of decreased fetal movement since yesterday. Reports not feeling the baby move since yesterday until coming to MAU. Reports normal movement since arrival to MAU. Denies abdominal pain, LOF, or vaginal bleeding.  Since being placed on fetal monitor is having contractions; pt states feels "tightening" but no pain. Was admitted 5/21 for vaginal bleeding & ctx; given BMZ 5/21 & 5/22. At that time SVE 0.5/50%. Was discharged on procardia. Has not taken procardia since last month.   Patient's last menstrual period was 07/08/2015.  OB History  Gravida Para Term Preterm AB SAB TAB Ectopic Multiple Living  1 0 0 0 0 0 0 0 0 0     # Outcome Date GA Lbr Len/2nd Weight Sex Delivery Anes PTL Lv  1 Current               Past Medical History  Diagnosis Date  . Asthma     History reviewed. No pertinent family history.  Social History   Social History  . Marital Status: Single    Spouse Name: N/A  . Number of Children: N/A  . Years of Education: N/A   Social History Main Topics  . Smoking status: Never Smoker   . Smokeless tobacco: None  . Alcohol Use: No  . Drug Use: No  . Sexual Activity: Yes   Other Topics Concern  . None   Social History Narrative    Allergies  Allergen Reactions  . Penicillins Anaphylaxis    Has patient had a PCN reaction causing immediate rash, facial/tongue/throat swelling, SOB or lightheadedness with hypotension: No Has patient had a PCN reaction causing severe rash involving mucus membranes or skin necrosis: Yes Has patient had a PCN reaction that required hospitalization Yes  If all of the above answers are "NO", then may proceed with Cephalosporin use.    . Eggs Or Egg-Derived Products Nausea And Vomiting    No current facility-administered medications on file prior to encounter.    Current Outpatient Prescriptions on File Prior to Encounter  Medication Sig Dispense Refill  . albuterol (PROVENTIL HFA;VENTOLIN HFA) 108 (90 Base) MCG/ACT inhaler Inhale 2 puffs into the lungs every 4 (four) hours as needed for wheezing or shortness of breath. 1 Inhaler 0  . albuterol (PROVENTIL) (2.5 MG/3ML) 0.083% nebulizer solution Take 3 mLs (2.5 mg total) by nebulization every 6 (six) hours as needed for wheezing or shortness of breath. 75 mL 0  . cetirizine (ZYRTEC ALLERGY) 10 MG tablet Take 1 tablet (10 mg total) by mouth daily. 30 tablet 1  . ferrous sulfate 325 (65 FE) MG tablet Take 1 tablet by mouth daily. Reported on 02/07/2016  6  . predniSONE (DELTASONE) 20 MG tablet Take 2 tablets (40 mg total) by mouth daily. Take 40 mg by mouth daily for 3 days, then 20mg  by mouth daily for 3 days, then 10mg  daily for 3 days 12 tablet 0  . Prenatal Vit-Fe Fumarate-FA (PRENATAL MULTIVITAMIN) TABS tablet Take 1 tablet by mouth daily at 12 noon.       Review of Systems  Constitutional: Negative.   Gastrointestinal: Negative for abdominal pain and constipation.  Genitourinary: Negative for dysuria and vaginal bleeding.     Physical Exam   Filed Vitals:   04/10/16 1613  BP: 113/59  Pulse: 80  Temp: 99 F (37.2  C)  TempSrc: Oral  Resp: 16  Height: 5\' 6"  (1.676 m)  Weight: 166 lb (75.297 kg)    Physical Exam  Nursing note and vitals reviewed. Constitutional: She is oriented to person, place, and time. She appears well-developed and well-nourished. No distress.  HENT:  Head: Normocephalic and atraumatic.  Eyes: Conjunctivae are normal. Right eye exhibits no discharge. Left eye exhibits no discharge. No scleral icterus.  Neck: Normal range of motion.  Cardiovascular: Normal rate.   Respiratory: Effort normal. No respiratory distress.  GI: Soft. There is no tenderness.  Neurological: She is alert and oriented to person, place, and time.  Skin: Skin is warm and dry. She is not  diaphoretic.  Psychiatric: She has a normal mood and affect. Her behavior is normal. Judgment and thought content normal.   Dilation: 1.5 Effacement (%): 50 Cervical Position: Middle Station: -3 Presentation: Vertex Exam by:: Estanislado SpireE. Christyan Reger NP  Fetal Tracing:  Baseline: 135 Variability: moderate Accelerations: 15x15 Decelerations: none  Toco: initially every 3-6 mins; irregular ctx prior to discharge   MAU Course  Procedures Results for orders placed or performed during the hospital encounter of 04/10/16 (from the past 24 hour(s))  Urinalysis, Routine w reflex microscopic (not at [facility])     Status: Abnormal   Collection Time: 04/10/16  3:55 PM  Result Value Ref Range   Color, Urine YELLOW YELLOW   APPearance CLEAR CLEAR   Specific Gravity, Urine 1.020 1.005 - 1.030   pH 5.5 5.0 - 8.0   Glucose, UA NEGATIVE NEGATIVE mg/dL   Hgb urine dipstick NEGATIVE NEGATIVE   Bilirubin Urine NEGATIVE NEGATIVE   Ketones, ur NEGATIVE NEGATIVE mg/dL   Protein, ur NEGATIVE NEGATIVE mg/dL   Nitrite NEGATIVE NEGATIVE   Leukocytes, UA SMALL (A) NEGATIVE  Urine microscopic-add on     Status: Abnormal   Collection Time: 04/10/16  3:55 PM  Result Value Ref Range   Squamous Epithelial / LPF 6-30 (A) NONE SEEN   WBC, UA 6-30 0 - 5 WBC/hpf   RBC / HPF NONE SEEN 0 - 5 RBC/hpf   Bacteria, UA MANY (A) NONE SEEN  Fetal fibronectin     Status: Abnormal   Collection Time: 04/10/16  4:32 PM  Result Value Ref Range   Fetal Fibronectin POSITIVE (A) NEGATIVE    MDM Reactive fetal tracing FFN sent d/t ctx & cervical change since last check FFN positive SVE unchanged after an hour & decrease in ctx frequency after PO hydration S/w Dr. Emelda FearFerguson -- ok to discharge home. Repeat BMZ dosing First dose of BMZ given in MAU  Assessment and Plan  A: 1. Preterm contractions, third trimester   2. Decreased fetal movement determined by examination, third trimester, not applicable or unspecified fetus   3. NST  (non-stress test) reactive     P: Discharge home Return tomorrow evening or Friday morning for 2nd BMZ Discussed preterm labor precautions & reasons to return to MAU Fetal kick count form   Judeth HornErin Mallarie Voorhies, NP 04/10/2016 4:20 PM

## 2016-04-10 NOTE — MAU Note (Signed)
Hasn't felt the baby move since last night.  Denies  Pain, bleeding or leaking.

## 2016-04-10 NOTE — Discharge Instructions (Signed)
Preterm Labor Information Preterm labor is when labor starts before you are [redacted] weeks pregnant. The normal length of pregnancy is 39 to 41 weeks.  CAUSES  The cause of preterm labor is not often known. The most common known cause is infection. RISK FACTORS  Having a history of preterm labor.  Having your water break before it should.  Having a placenta that covers the opening of the cervix.  Having a placenta that breaks away from the uterus.  Having a cervix that is too weak to hold the baby in the uterus.  Having too much fluid in the amniotic sac.  Taking drugs or smoking while pregnant.  Not gaining enough weight while pregnant.  Being younger than 2718 and older than 20 years old.  Having a low income.  Being African American. SYMPTOMS 1. Period-like cramps, belly (abdominal) pain, or back pain. 2. Contractions that are regular, as often as six in an hour. They may be mild or painful. 3. Contractions that start at the top of the belly. They then move to the lower belly and back. 4. Lower belly pressure that seems to get stronger. 5. Bleeding from the vagina. 6. Fluid leaking from the vagina. TREATMENT  Treatment depends on:  Your condition.  The condition of your baby.  How many weeks pregnant you are. Your doctor may have you:  Take medicine to stop contractions.  Stay in bed except to use the restroom (bed rest).  Stay in the hospital. WHAT SHOULD YOU DO IF YOU THINK YOU ARE IN PRETERM LABOR? Call your doctor right away. You need to go to the hospital right away.  HOW CAN YOU PREVENT PRETERM LABOR IN FUTURE PREGNANCIES?  Stop smoking, if you smoke.  Maintain healthy weight gain.  Do not take drugs or be around chemicals that are not needed.  Tell your doctor if you think you have an infection.  Tell your doctor if you had a preterm labor before.   This information is not intended to replace advice given to you by your health care provider. Make sure  you discuss any questions you have with your health care provider.   Document Released: 12/20/2008 Document Revised: 02/07/2015 Document Reviewed: 10/26/2012 Elsevier Interactive Patient Education 2016 Elsevier Inc.   Fetal Movement Counts Patient Name: __________________________________________________ Patient Due Date: ____________________ Performing a fetal movement count is highly recommended in high-risk pregnancies, but it is good for every pregnant woman to do. Your health care provider may ask you to start counting fetal movements at 28 weeks of the pregnancy. Fetal movements often increase:  After eating a full meal.  After physical activity.  After eating or drinking something sweet or cold.  At rest. Pay attention to when you feel the baby is most active. This will help you notice a pattern of your baby's sleep and wake cycles and what factors contribute to an increase in fetal movement. It is important to perform a fetal movement count at the same time each day when your baby is normally most active.  HOW TO COUNT FETAL MOVEMENTS 7. Find a quiet and comfortable area to sit or lie down on your left side. Lying on your left side provides the best blood and oxygen circulation to your baby. 8. Write down the day and time on a sheet of paper or in a journal. 9. Start counting kicks, flutters, swishes, rolls, or jabs in a 2-hour period. You should feel at least 10 movements within 2 hours. 10. If you  do not feel 10 movements in 2 hours, wait 2-3 hours and count again. Look for a change in the pattern or not enough counts in 2 hours. SEEK MEDICAL CARE IF:  You feel less than 10 counts in 2 hours, tried twice.  There is no movement in over an hour.  The pattern is changing or taking longer each day to reach 10 counts in 2 hours.  You feel the baby is not moving as he or she usually does. Date: ____________ Movements: ____________ Start time: ____________ Doreatha Martin time: ____________   Date: ____________ Movements: ____________ Start time: ____________ Doreatha Martin time: ____________ Date: ____________ Movements: ____________ Start time: ____________ Doreatha Martin time: ____________ Date: ____________ Movements: ____________ Start time: ____________ Doreatha Martin time: ____________ Date: ____________ Movements: ____________ Start time: ____________ Doreatha Martin time: ____________ Date: ____________ Movements: ____________ Start time: ____________ Doreatha Martin time: ____________ Date: ____________ Movements: ____________ Start time: ____________ Doreatha Martin time: ____________ Date: ____________ Movements: ____________ Start time: ____________ Doreatha Martin time: ____________  Date: ____________ Movements: ____________ Start time: ____________ Doreatha Martin time: ____________ Date: ____________ Movements: ____________ Start time: ____________ Doreatha Martin time: ____________ Date: ____________ Movements: ____________ Start time: ____________ Doreatha Martin time: ____________ Date: ____________ Movements: ____________ Start time: ____________ Doreatha Martin time: ____________ Date: ____________ Movements: ____________ Start time: ____________ Doreatha Martin time: ____________ Date: ____________ Movements: ____________ Start time: ____________ Doreatha Martin time: ____________ Date: ____________ Movements: ____________ Start time: ____________ Doreatha Martin time: ____________  Date: ____________ Movements: ____________ Start time: ____________ Doreatha Martin time: ____________ Date: ____________ Movements: ____________ Start time: ____________ Doreatha Martin time: ____________ Date: ____________ Movements: ____________ Start time: ____________ Doreatha Martin time: ____________ Date: ____________ Movements: ____________ Start time: ____________ Doreatha Martin time: ____________ Date: ____________ Movements: ____________ Start time: ____________ Doreatha Martin time: ____________ Date: ____________ Movements: ____________ Start time: ____________ Doreatha Martin time: ____________ Date: ____________ Movements: ____________  Start time: ____________ Doreatha Martin time: ____________  Date: ____________ Movements: ____________ Start time: ____________ Doreatha Martin time: ____________ Date: ____________ Movements: ____________ Start time: ____________ Doreatha Martin time: ____________ Date: ____________ Movements: ____________ Start time: ____________ Doreatha Martin time: ____________ Date: ____________ Movements: ____________ Start time: ____________ Doreatha Martin time: ____________ Date: ____________ Movements: ____________ Start time: ____________ Doreatha Martin time: ____________ Date: ____________ Movements: ____________ Start time: ____________ Doreatha Martin time: ____________ Date: ____________ Movements: ____________ Start time: ____________ Doreatha Martin time: ____________  Date: ____________ Movements: ____________ Start time: ____________ Doreatha Martin time: ____________ Date: ____________ Movements: ____________ Start time: ____________ Doreatha Martin time: ____________ Date: ____________ Movements: ____________ Start time: ____________ Doreatha Martin time: ____________ Date: ____________ Movements: ____________ Start time: ____________ Doreatha Martin time: ____________ Date: ____________ Movements: ____________ Start time: ____________ Doreatha Martin time: ____________ Date: ____________ Movements: ____________ Start time: ____________ Doreatha Martin time: ____________ Date: ____________ Movements: ____________ Start time: ____________ Doreatha Martin time: ____________  Date: ____________ Movements: ____________ Start time: ____________ Doreatha Martin time: ____________ Date: ____________ Movements: ____________ Start time: ____________ Doreatha Martin time: ____________ Date: ____________ Movements: ____________ Start time: ____________ Doreatha Martin time: ____________ Date: ____________ Movements: ____________ Start time: ____________ Doreatha Martin time: ____________ Date: ____________ Movements: ____________ Start time: ____________ Doreatha Martin time: ____________ Date: ____________ Movements: ____________ Start time: ____________ Doreatha Martin time:  ____________ Date: ____________ Movements: ____________ Start time: ____________ Doreatha Martin time: ____________  Date: ____________ Movements: ____________ Start time: ____________ Doreatha Martin time: ____________ Date: ____________ Movements: ____________ Start time: ____________ Doreatha Martin time: ____________ Date: ____________ Movements: ____________ Start time: ____________ Doreatha Martin time: ____________ Date: ____________ Movements: ____________ Start time: ____________ Doreatha Martin time: ____________ Date: ____________ Movements: ____________ Start time: ____________ Doreatha Martin time: ____________ Date: ____________ Movements: ____________ Start time: ____________ Doreatha Martin time: ____________ Date: ____________ Movements: ____________ Start time: ____________ Doreatha Martin time: ____________  Date: ____________ Movements: ____________ Start  time: ____________ Doreatha MartinFinish time: ____________ Date: ____________ Movements: ____________ Start time: ____________ Doreatha MartinFinish time: ____________ Date: ____________ Movements: ____________ Start time: ____________ Doreatha MartinFinish time: ____________ Date: ____________ Movements: ____________ Start time: ____________ Doreatha MartinFinish time: ____________ Date: ____________ Movements: ____________ Start time: ____________ Doreatha MartinFinish time: ____________ Date: ____________ Movements: ____________ Start time: ____________ Doreatha MartinFinish time: ____________   This information is not intended to replace advice given to you by your health care provider. Make sure you discuss any questions you have with your health care provider.   Document Released: 10/23/2006 Document Revised: 10/14/2014 Document Reviewed: 07/20/2012 Elsevier Interactive Patient Education [employer] Elsevier Inc[employer]

## 2016-04-11 ENCOUNTER — Inpatient Hospital Stay (HOSPITAL_COMMUNITY)
Admission: AD | Admit: 2016-04-11 | Discharge: 2016-04-11 | Disposition: A | Discharge status: Home / Self Care | Payer: Medicaid Other | Source: Ambulatory Visit | Attending: Obstetrics and Gynecology | Admitting: Obstetrics and Gynecology

## 2016-04-11 DIAGNOSIS — Z3A00 Weeks of gestation of pregnancy not specified: Secondary | ICD-10-CM | POA: Insufficient documentation

## 2016-04-11 DIAGNOSIS — O368130 Decreased fetal movements, third trimester, not applicable or unspecified: Secondary | ICD-10-CM | POA: Diagnosis not present

## 2016-04-11 MED ORDER — BETAMETHASONE SOD PHOS & ACET 6 (3-3) MG/ML IJ SUSP
12.0000 mg | Freq: Once | INTRAMUSCULAR | Status: AC
  Administered 2016-04-11: 12 mg via INTRAMUSCULAR
  Filled 2016-04-11: qty 2

## 2016-04-11 NOTE — MAU Note (Signed)
Pt here for Betamethasone injection #2.

## 2016-04-18 ENCOUNTER — Ambulatory Visit (INDEPENDENT_AMBULATORY_CARE_PROVIDER_SITE_OTHER): Payer: Medicaid Other | Admitting: Obstetrics & Gynecology

## 2016-04-18 VITALS — BP 125/73 | HR 84 | Wt 171.0 lb

## 2016-04-18 DIAGNOSIS — Z3493 Encounter for supervision of normal pregnancy, unspecified, third trimester: Secondary | ICD-10-CM | POA: Insufficient documentation

## 2016-04-18 DIAGNOSIS — Z3483 Encounter for supervision of other normal pregnancy, third trimester: Secondary | ICD-10-CM | POA: Diagnosis not present

## 2016-04-18 NOTE — Progress Notes (Signed)
Subjective:  Jodi Roth is a 20 y.o. G1P0000 at 665w0d being seen today for ongoing prenatal care; she is a transfer from Dr. Elsie StainMarshall's office.  She is currently monitored for the following issues for this low-risk pregnancy and has Vaginal bleeding in pregnancy and Supervision of normal pregnancy in third trimester on her problem list.  Patient reports no complaints.  Contractions: Irregular. Vag. Bleeding: None.  Movement: Present. Denies leaking of fluid.   The following portions of the patient's history were reviewed and updated as appropriate: allergies, current medications, past family history, past medical history, past social history, past surgical history and problem list. Problem list updated.  Objective:   Filed Vitals:   04/18/16 0847  BP: 125/73  Pulse: 84  Weight: 171 lb (77.565 kg)    Fetal Status: Fetal Heart Rate (bpm): 148 Fundal Height: 36 cm Movement: Present     General:  Alert, oriented and cooperative. Patient is in no acute distress.  Skin: Skin is warm and dry. No rash noted.   Cardiovascular: Normal heart rate noted  Respiratory: Normal respiratory effort, no problems with respiration noted  Abdomen: Soft, gravid, appropriate for gestational age. Pain/Pressure: Present     Pelvic:  Cervical exam deferred        Extremities: Normal range of motion.  Edema: Trace  Mental Status: Normal mood and affect. Normal behavior. Normal judgment and thought content.   Urinalysis: Urine Protein: Negative Urine Glucose: Negative  Assessment and Plan:  Pregnancy: G1P0000 at 3965w0d  Supervision of normal pregnancy in third trimester Discussed TDap, patient will decide by next visit The nature [facility] with multiple MDs and other Advanced Practice Providers was explained to patient; also emphasized that residents, students are part of our team. Preterm labor symptoms and general obstetric precautions including but not limited  to vaginal bleeding, contractions, leaking of fluid and fetal movement were reviewed in detail with the patient. Please refer to After Visit Summary for other counseling recommendations.  Return in about 1 week (around 04/25/2016) for OB Visit, Pelvic cultures.   Tereso NewcomerUgonna A Anyanwu, MD

## 2016-04-18 NOTE — Patient Instructions (Addendum)
Return to clinic for any scheduled appointments or obstetric concerns, or go to [facility] for evaluation  Thank you for enrolling in MyChart. Please follow the instructions below to securely access your online medical record. MyChart allows you to send messages to your doctor, view your test results, renew your prescriptions, schedule appointments, and more.  How Do I Sign Up? 1. In your Internet browser, go to [email or web address] 2. Click on the New  User? link in the Sign In box.  3. Enter your MyChart Access Code exactly as it appears below. You will not need to use this code after you have completed the sign-up process. If you do not sign up before the expiration date, you must request a new code. MyChart Access Code: [email or web address] Expires: 06/02/2016  1:55 PM  4. Enter the last four digits of your Social Security Number (xxxx) and Date of Birth (mm/dd/yyyy) as indicated and click Next. You will be taken to the next sign-up page. 5. Create a MyChart ID. This will be your MyChart login ID and cannot be changed, so think of one that is secure and easy to remember. 6. Create a MyChart password. You can change your password at any time. 7. Enter your Password Reset Question and Answer and click Next. This can be used at a later time if you forget your password.  8. Select your communication preference, and if applicable enter your e-mail address. You will receive e-mail notification when new information is available in MyChart by choosing to receive e-mail notifications and filling in your e-mail. 9. Click Sign In. You can now view your medical record.   Additional Information If you have questions, you can email [email or web address] or call [phone] to talk to our MyChart staff. Remember, MyChart is NOT to be used for urgent needs. For medical emergencies, dial 911.  Tdap Vaccine (Tetanus, Diphtheria and Pertussis): What You Need to Know 1. Why get  vaccinated? Tetanus, diphtheria and pertussis are very serious diseases. Tdap vaccine can protect [place] from these diseases. And, Tdap vaccine given to pregnant women can protect newborn babies against pertussis. TETANUS (Lockjaw) is rare in the [place] today. It causes painful muscle tightening and stiffness, usually all over the body.  It can lead to tightening of muscles in the head and neck so you can't open your mouth, swallow, or sometimes even breathe. Tetanus kills about 1 out of 10 people who are infected even after receiving the best medical care. DIPHTHERIA is also rare in the [place] today. It can cause a thick coating to form in the back of the throat.  It can lead to breathing problems, heart failure, paralysis, and death. PERTUSSIS (Whooping Cough) causes severe coughing spells, which can cause difficulty breathing, vomiting and disturbed sleep.  It can also lead to weight loss, incontinence, and rib fractures. Up to 2 in 100 adolescents and 5 in 100 adults with pertussis are hospitalized or have complications, which could include pneumonia or death. These diseases are caused by bacteria. Diphtheria and pertussis are spread from person to person through secretions from coughing or sneezing. Tetanus enters the body through cuts, scratches, or wounds. Before vaccines, as many as 200,000 cases of diphtheria, 200,000 cases of pertussis, and hundreds of cases of tetanus, were reported in the [place] each year. Since vaccination began, reports of cases for tetanus and diphtheria have dropped by about 99% and for pertussis by about 80%. 2. Tdap vaccine Tdap vaccine can protect  adolescents and adults from tetanus, diphtheria, and pertussis. One dose of Tdap is routinely given at age 20 or 2612. People who did not get Tdap at that age should get it as soon as possible. Tdap is especially important for healthcare professionals and anyone having close contact with a baby younger than  12 months. Pregnant women should get a dose of Tdap during every pregnancy, to protect the newborn from pertussis. Infants are most at risk for severe, life-threatening complications from pertussis. Another vaccine, called Td, protects against tetanus and diphtheria, but not pertussis. A Td booster should be given every 10 years. Tdap may be given as one of these boosters if you have never gotten Tdap before. Tdap may also be given after a severe cut or burn to prevent tetanus infection. Your doctor or the person giving you the vaccine can give you more information. Tdap may safely be given at the same time as other vaccines. 3. Some people should not get this vaccine  A person who has ever had a life-threatening allergic reaction after a previous dose of any diphtheria, tetanus or pertussis containing vaccine, OR has a severe allergy to any part of this vaccine, should not get Tdap vaccine. Tell the person giving the vaccine about any severe allergies.  Anyone who had coma or long repeated seizures within 7 days after a childhood dose of DTP or DTaP, or a previous dose of Tdap, should not get Tdap, unless a cause other than the vaccine was found. They can still get Td.  Talk to your doctor if you:  have seizures or another nervous system problem,  had severe pain or swelling after any vaccine containing diphtheria, tetanus or pertussis,  ever had a condition called Guillain-Barr Syndrome (GBS),  aren't feeling well on the day the shot is scheduled. 4. Risks With any medicine, including vaccines, there is a chance of side effects. These are usually mild and go away on their own. Serious reactions are also possible but are rare. Most people who get Tdap vaccine do not have any problems with it. Mild problems following Tdap (Did not interfere with activities)  Pain where the shot was given (about 3 in 4 adolescents or 2 in 3 adults)  Redness or swelling where the shot was given (about 1  person in 5)  Mild fever of at least 100.27F (up to about 1 in 25 adolescents or 1 in 100 adults)  Headache (about 3 or 4 people in 10)  Tiredness (about 1 person in 3 or 4)  Nausea, vomiting, diarrhea, stomach ache (up to 1 in 4 adolescents or 1 in 10 adults)  Chills, sore joints (about 1 person in 10)  Body aches (about 1 person in 3 or 4)  Rash, swollen glands (uncommon) Moderate problems following Tdap (Interfered with activities, but did not require medical attention)  Pain where the shot was given (up to 1 in 5 or 6)  Redness or swelling where the shot was given (up to about 1 in 16 adolescents or 1 in 12 adults)  Fever over 102F (about 1 in 100 adolescents or 1 in 250 adults)  Headache (about 1 in 7 adolescents or 1 in 10 adults)  Nausea, vomiting, diarrhea, stomach ache (up to 1 or 3 people in 100)  Swelling of the entire arm where the shot was given (up to about 1 in 500). Severe problems following Tdap (Unable to perform usual activities; required medical attention)  Swelling, severe pain, bleeding and redness  in the arm where the shot was given (rare). Problems that could happen after any vaccine:  People sometimes faint after a medical procedure, including vaccination. Sitting or lying down for about 15 minutes can help prevent fainting, and injuries caused by a fall. Tell your doctor if you feel dizzy, or have vision changes or ringing in the ears.  Some people get severe pain in the shoulder and have difficulty moving the arm where a shot was given. This happens very rarely.  Any medication can cause a severe allergic reaction. Such reactions from a vaccine are very rare, estimated at fewer than 1 in a million doses, and would happen within a few minutes to a few hours after the vaccination. As with any medicine, there is a very remote chance of a vaccine causing a serious injury or death. The safety of vaccines is always being monitored. For more information,  visit: http://floyd.org/ 5. What if there is a serious problem? What should I look for?  Look for anything that concerns you, such as signs of a severe allergic reaction, very high fever, or unusual behavior.  Signs of a severe allergic reaction can include hives, swelling of the face and throat, difficulty breathing, a fast heartbeat, dizziness, and weakness. These would usually start a few minutes to a few hours after the vaccination. What should I do?  If you think it is a severe allergic reaction or other emergency that can't wait, call 9-1-1 or get the person to the nearest hospital. Otherwise, call your doctor.  Afterward, the reaction should be reported to the Vaccine Adverse Event Reporting System (VAERS). Your doctor might file this report, or you can do it yourself through the VAERS web site at www.vaers.LAgents.no, or by calling 1-316-461-7702. VAERS does not give medical advice.  6. The National Vaccine Injury Compensation Program The [employer] Vaccine Injury Compensation Program (VICP) is a federal program that was created to compensate people who may have been injured by certain vaccines. Persons who believe they may have been injured by a vaccine can learn about the program and about filing a claim by calling 1-440 231 4239 or visiting the VICP website at SpiritualWord.at. There is a time limit to file a claim for compensation. 7. How can I learn more?  Ask your doctor. He or she can give you the vaccine package insert or suggest other sources of information.  Call your local or state health department.  Contact the Centers for Disease Control and Prevention (CDC):  Call 484 412 4964 (1-800-CDC-INFO) or  Visit CDC's website at PicCapture.uy CDC Tdap Vaccine VIS (11/30/13)   This information is not intended to replace advice given to you by your health care provider. Make sure you discuss any questions you have with your health care provider.    Document Released: 03/24/2012 Document Revised: 10/14/2014 Document Reviewed: 01/05/2014 Elsevier Interactive Patient Education 2016 [employer].  Contraception Choices Contraception (birth control) is the use of any methods or devices to prevent pregnancy. Below are some methods to help avoid pregnancy. HORMONAL METHODS   Contraceptive implant. This is a thin, plastic tube containing progesterone hormone. It does not contain estrogen hormone. Your health care provider inserts the tube in the inner part of the upper arm. The tube can remain in place for up to 3 years. After 3 years, the implant must be removed. The implant prevents the ovaries from releasing an egg (ovulation), thickens the cervical mucus to prevent sperm from entering the uterus, and thins the lining of the inside  of the uterus.  Progesterone-only injections. These injections are given every 3 months by your health care provider to prevent pregnancy. This synthetic progesterone hormone stops the ovaries from releasing eggs. It also thickens cervical mucus and changes the uterine lining. This makes it harder for sperm to survive in the uterus.  Birth control pills. These pills contain estrogen and progesterone hormone. They work by preventing the ovaries from releasing eggs (ovulation). They also cause the cervical mucus to thicken, preventing the sperm from entering the uterus. Birth control pills are prescribed by a health care provider.Birth control pills can also be used to treat heavy periods.  Minipill. This type of birth control pill contains only the progesterone hormone. They are taken every day of each month and must be prescribed by your health care provider.  Birth control patch. The patch contains hormones similar to those in birth control pills. It must be changed once a week and is prescribed by a health care provider.  Vaginal ring. The ring contains hormones similar to those in birth control pills. It is left  in the vagina for 3 weeks, removed for 1 week, and then a new one is put back in place. The patient must be comfortable inserting and removing the ring from the vagina.A health care provider's prescription is necessary.  Emergency contraception. Emergency contraceptives prevent pregnancy after unprotected sexual intercourse. This pill can be taken right after sex or up to 5 days after unprotected sex. It is most effective the sooner you take the pills after having sexual intercourse. Most emergency contraceptive pills are available without a prescription. Check with your pharmacist. Do not use emergency contraception as your only form of birth control. BARRIER METHODS   Female condom. This is a thin sheath (latex or rubber) that is worn over the penis during sexual intercourse. It can be used with spermicide to increase effectiveness.  Female condom. This is a soft, loose-fitting sheath that is put into the vagina before sexual intercourse.  Diaphragm. This is a soft, latex, dome-shaped barrier that must be fitted by a health care provider. It is inserted into the vagina, along with a spermicidal jelly. It is inserted before intercourse. The diaphragm should be left in the vagina for 6 to 8 hours after intercourse.  Cervical cap. This is a round, soft, latex or plastic cup that fits over the cervix and must be fitted by a health care provider. The cap can be left in place for up to 48 hours after intercourse.  Sponge. This is a soft, circular piece of polyurethane foam. The sponge has spermicide in it. It is inserted into the vagina after wetting it and before sexual intercourse.  Spermicides. These are chemicals that kill or block sperm from entering the cervix and uterus. They come in the form of creams, jellies, suppositories, foam, or tablets. They do not require a prescription. They are inserted into the vagina with an applicator before having sexual intercourse. The process must be repeated every  time you have sexual intercourse. INTRAUTERINE CONTRACEPTION  Intrauterine device (IUD). This is a T-shaped device that is put in a woman's uterus during a menstrual period to prevent pregnancy. There are 2 types:  Copper IUD. This type of IUD is wrapped in copper wire and is placed inside the uterus. Copper makes the uterus and fallopian tubes produce a fluid that kills sperm. It can stay in place for 10 years.  Hormone IUD. This type of IUD contains the hormone progestin (synthetic progesterone).  The hormone thickens the cervical mucus and prevents sperm from entering the uterus, and it also thins the uterine lining to prevent implantation of a fertilized egg. The hormone can weaken or kill the sperm that get into the uterus. It can stay in place for 3-5 years, depending on which type of IUD is used. PERMANENT METHODS OF CONTRACEPTION  Female tubal ligation. This is when the woman's fallopian tubes are surgically sealed, tied, or blocked to prevent the egg from traveling to the uterus.  Hysteroscopic sterilization. This involves placing a small coil or insert into each fallopian tube. Your doctor uses a technique called hysteroscopy to do the procedure. The device causes scar tissue to form. This results in permanent blockage of the fallopian tubes, so the sperm cannot fertilize the egg. It takes about 3 months after the procedure for the tubes to become blocked. You must use another form of birth control for these 3 months.  Female sterilization. This is when the female has the tubes that carry sperm tied off (vasectomy).This blocks sperm from entering the vagina during sexual intercourse. After the procedure, the man can still ejaculate fluid (semen). NATURAL PLANNING METHODS  Natural family planning. This is not having sexual intercourse or using a barrier method (condom, diaphragm, cervical cap) on days the woman could become pregnant.  Calendar method. This is keeping track of the length of  each menstrual cycle and identifying when you are fertile.  Ovulation method. This is avoiding sexual intercourse during ovulation.  Symptothermal method. This is avoiding sexual intercourse during ovulation, using a thermometer and ovulation symptoms.  Post-ovulation method. This is timing sexual intercourse after you have ovulated. Regardless of which type or method of contraception you choose, it is important that you use condoms to protect against the transmission of sexually transmitted infections (STIs). Talk with your health care provider about which form of contraception is most appropriate for you.   This information is not intended to replace advice given to you by your health care provider. Make sure you discuss any questions you have with your health care provider.   Document Released: 09/23/2005 Document Revised: 09/28/2013 Document Reviewed: 03/18/2013 Elsevier Interactive Patient Education 2016 [employer].  AREA PEDIATRIC/FAMILY PRACTICE PHYSICIANS  [facility] [address] [place], [place] [address] Phone - [phone]   Fax - [phone]  JACK AMOS [address] [place], [place]  [address] Phone - [phone]   Fax - [phone]  [facility] [address] [place], [place]  [address] Phone - [phone]   Fax - [phone]  [facility] [address] [place], [place]  [address] Phone - [phone]   Fax - [phone]  [facility] [address] [place], [place]  [address] Phone - [phone]   Fax - [phone]  [facility] [address] [place], [place]  [address] Phone - [phone]   Fax - [phone]  [facility] [address] [place], [place]  [address] Phone - [phone]   Fax - [phone]  [facility] AT [facility] [address], [address] [place], [place]   [address] Phone - [phone]   Fax - [phone]  [facility] AT [facility] [address] [place], [place]  [address] Phone - [phone]   Fax - [phone] [facility] AT [place] [address] [place], [place]  [address] Phone - [phone]  Fax - [phone]  [facility] [address] [place], [place]  [address] Phone - [phone]   Fax - [phone]  [facility] [address] [place], [place]  [address] Phone - [phone]   Fax - [phone]  [facility] [address] [place], [place]  [address] Phone - [phone]   Fax - [phone]  [facility] [address], [address] [place], [place]  [address] Phone - [phone]  [facility] [address] [place], [place]  [address] Phone - [phone]   Fax - [phone]  [facility] [address] [place], [place]  [address] Phone - [phone]   Fax - [phone]  [facility] [address] [place], [place]  [address] Phone - [phone]   Fax - [phone]  [facility] [address] [place], [place]  [address] Phone - [phone]   Fax - [phone]   [facility] [address] [place], [place]  [address] Phone - [phone]   Fax - [phone]  [facility] [address] [place], [place]  [address] Phone - [phone]   Fax - [phone]  DAVID RUBIN [address] [place], [place]  [address] Phone - [phone]   Fax - [phone]  [facility] [address] [place], [place]  [address] Phone - [phone]   Fax - [phone]  [facility] Alita Chyle [address] [place], [place]  [address] Phone - [phone]   Fax - [phone] Gerarda Fraction [address] [place], [place]   [address] Phone - [phone]   Fax - [phone]  [facility] [address] [place], [place]  [address] Phone - [phone]   Fax - [phone]  [facility] - [place] [address] [place], [place]  [address] Phone - [phone]   Fax - [phone]

## 2016-04-24 ENCOUNTER — Encounter: Payer: Medicaid Other | Admitting: Obstetrics & Gynecology

## 2016-04-25 ENCOUNTER — Encounter: Payer: Self-pay | Admitting: *Deleted

## 2016-05-07 ENCOUNTER — Ambulatory Visit (INDEPENDENT_AMBULATORY_CARE_PROVIDER_SITE_OTHER): Payer: Medicaid Other | Admitting: Obstetrics & Gynecology

## 2016-05-07 VITALS — BP 117/70 | HR 67 | Temp 98.7°F | Wt 177.5 lb

## 2016-05-07 DIAGNOSIS — Z1389 Encounter for screening for other disorder: Secondary | ICD-10-CM

## 2016-05-07 DIAGNOSIS — Z331 Pregnant state, incidental: Secondary | ICD-10-CM

## 2016-05-07 DIAGNOSIS — Z3493 Encounter for supervision of normal pregnancy, unspecified, third trimester: Secondary | ICD-10-CM

## 2016-05-07 DIAGNOSIS — Z3483 Encounter for supervision of other normal pregnancy, third trimester: Secondary | ICD-10-CM

## 2016-05-07 LAB — POCT URINALYSIS DIPSTICK
Bilirubin, UA: NEGATIVE
Glucose, UA: NEGATIVE
KETONES UA: NEGATIVE
Nitrite, UA: NEGATIVE
PH UA: 7
PROTEIN UA: NEGATIVE
RBC UA: NEGATIVE
SPEC GRAV UA: 1.015
Urobilinogen, UA: 0.2

## 2016-05-07 NOTE — Progress Notes (Signed)
Pt c/o lower abdominal and rectal pressure. She is requesting her cervix be checked today.

## 2016-05-07 NOTE — Patient Instructions (Signed)
Return to clinic for any scheduled appointments or for any gynecologic concerns as needed.   Thank you for enrolling in MyChart. Please follow the instructions below to securely access your online medical record. MyChart allows you to send messages to your doctor, view your test results, renew your prescriptions, schedule appointments, and more.  How Do I Sign Up? 1. In your Internet browser, go to [email or web address] 2. Click on the New  User? link in the Sign In box.  3. Enter your MyChart Access Code exactly as it appears below. You will not need to use this code after you have completed the sign-up process. If you do not sign up before the expiration date, you must request a new code. MyChart Access Code: [email or web address] Expires: 06/02/2016  1:55 PM  4. Enter the last four digits of your Social Security Number (xxxx) and Date of Birth (mm/dd/yyyy) as indicated and click Next. You will be taken to the next sign-up page. 5. Create a MyChart ID. This will be your MyChart login ID and cannot be changed, so think of one that is secure and easy to remember. 6. Create a MyChart password. You can change your password at any time. 7. Enter your Password Reset Question and Answer and click Next. This can be used at a later time if you forget your password.  8. Select your communication preference, and if applicable enter your e-mail address. You will receive e-mail notification when new information is available in MyChart by choosing to receive e-mail notifications and filling in your e-mail. 9. Click Sign In. You can now view your medical record.   Additional Information If you have questions, you can email [email or web address] or call [phone] to talk to our MyChart staff. Remember, MyChart is NOT to be used for urgent needs. For medical emergencies, dial 911.

## 2016-05-07 NOTE — Progress Notes (Signed)
Subjective:  Jodi Roth is a 20 y.o. G1P0000 at [redacted]w[redacted]d being seen today for ongoing prenatal care.  She is currently monitored for the following issues for this low-risk pregnancy and has Supervision of normal pregnancy in third trimester on her problem list.  Patient reports occasional contractions.  Contractions: Irregular. Vag. Bleeding: None.  Movement: Present. Denies leaking of fluid.   The following portions of the patient's history were reviewed and updated as appropriate: allergies, current medications, past family history, past medical history, past social history, past surgical history and problem list. Problem list updated.  Objective:   Vitals:   05/07/16 0802  BP: 117/70  Pulse: 67  Temp: 98.7 F (37.1 C)  Weight: 177 lb 8 oz (80.5 kg)    Fetal Status: Fetal Heart Rate (bpm): 145 Fundal Height: 37 cm Movement: Present  Presentation: Vertex  General:  Alert, oriented and cooperative. Patient is in no acute distress.  Skin: Skin is warm and dry. No rash noted.   Cardiovascular: Normal heart rate noted  Respiratory: Normal respiratory effort, no problems with respiration noted  Abdomen: Soft, gravid, appropriate for gestational age. Pain/Pressure: Present     Pelvic:  Cervical exam performed Dilation: 3 Effacement (%): 80 Station: -2  Extremities: Normal range of motion.  Edema: Trace  Mental Status: Normal mood and affect. Normal behavior. Normal judgment and thought content.   Urinalysis: Urine Protein: Negative Urine Glucose: Negative  Assessment and Plan:  Pregnancy: G1P0000 at [redacted]w[redacted]d  1. Supervision of normal pregnancy in third trimester Pelvic cultures done today, will follow up results and manage accordingly. - Strep Gp B Culture+Rflx - GC/Chlamydia Probe Amp - POCT Urinalysis Dipstick Term labor symptoms and general obstetric precautions including but not limited to vaginal bleeding, contractions, leaking of fluid and fetal movement were reviewed in  detail with the patient. Please refer to After Visit Summary for other counseling recommendations.  Return in about 1 week (around 05/14/2016) for OB Visit.   Tereso Newcomer, MD

## 2016-05-08 ENCOUNTER — Encounter (HOSPITAL_COMMUNITY): Payer: Self-pay | Admitting: *Deleted

## 2016-05-08 ENCOUNTER — Inpatient Hospital Stay (HOSPITAL_COMMUNITY)
Admission: AD | Admit: 2016-05-08 | Discharge: 2016-05-10 | DRG: 775 | Disposition: A | Discharge status: Home / Self Care | Payer: BLUE CROSS/BLUE SHIELD | Source: Ambulatory Visit | Attending: Obstetrics & Gynecology | Admitting: Obstetrics & Gynecology

## 2016-05-08 DIAGNOSIS — Z79899 Other long term (current) drug therapy: Secondary | ICD-10-CM

## 2016-05-08 DIAGNOSIS — Z3A37 37 weeks gestation of pregnancy: Secondary | ICD-10-CM

## 2016-05-08 DIAGNOSIS — Z91012 Allergy to eggs: Secondary | ICD-10-CM

## 2016-05-08 DIAGNOSIS — Z880 Allergy status to penicillin: Secondary | ICD-10-CM

## 2016-05-08 DIAGNOSIS — J45909 Unspecified asthma, uncomplicated: Secondary | ICD-10-CM | POA: Diagnosis present

## 2016-05-08 DIAGNOSIS — Z23 Encounter for immunization: Secondary | ICD-10-CM | POA: Diagnosis not present

## 2016-05-08 DIAGNOSIS — Z3493 Encounter for supervision of normal pregnancy, unspecified, third trimester: Secondary | ICD-10-CM

## 2016-05-08 DIAGNOSIS — IMO0001 Reserved for inherently not codable concepts without codable children: Secondary | ICD-10-CM

## 2016-05-08 DIAGNOSIS — O360930 Maternal care for other rhesus isoimmunization, third trimester, not applicable or unspecified: Secondary | ICD-10-CM | POA: Diagnosis present

## 2016-05-08 DIAGNOSIS — O9952 Diseases of the respiratory system complicating childbirth: Principal | ICD-10-CM | POA: Diagnosis present

## 2016-05-08 LAB — GC/CHLAMYDIA PROBE AMP
Chlamydia trachomatis, NAA: NEGATIVE
Neisseria gonorrhoeae by PCR: NEGATIVE

## 2016-05-08 LAB — CBC
HEMATOCRIT: 34.7 % — AB (ref 36.0–46.0)
HEMOGLOBIN: 11.9 g/dL — AB (ref 12.0–15.0)
MCH: 29 pg (ref 26.0–34.0)
MCHC: 34.3 g/dL (ref 30.0–36.0)
MCV: 84.4 fL (ref 78.0–100.0)
Platelets: 201 10*3/uL (ref 150–400)
RBC: 4.11 MIL/uL (ref 3.87–5.11)
RDW: 14.7 % (ref 11.5–15.5)
WBC: 11.4 10*3/uL — ABNORMAL HIGH (ref 4.0–10.5)

## 2016-05-08 LAB — TYPE AND SCREEN
ABO/RH(D): A NEG
Antibody Screen: NEGATIVE

## 2016-05-08 LAB — OB RESULTS CONSOLE GBS: STREP GROUP B AG: NEGATIVE

## 2016-05-08 LAB — GROUP B STREP BY PCR: GROUP B STREP BY PCR: NEGATIVE

## 2016-05-08 LAB — POCT FERN TEST: POCT Fern Test: POSITIVE

## 2016-05-08 MED ORDER — PHENYLEPHRINE 40 MCG/ML (10ML) SYRINGE FOR IV PUSH (FOR BLOOD PRESSURE SUPPORT)
80.0000 ug | PREFILLED_SYRINGE | INTRAVENOUS | Status: DC | PRN
  Filled 2016-05-08: qty 5

## 2016-05-08 MED ORDER — EPHEDRINE 5 MG/ML INJ
10.0000 mg | INTRAVENOUS | Status: DC | PRN
  Filled 2016-05-08: qty 4

## 2016-05-08 MED ORDER — ONDANSETRON HCL 4 MG/2ML IJ SOLN
4.0000 mg | Freq: Once | INTRAMUSCULAR | Status: AC
  Administered 2016-05-08: 4 mg via INTRAVENOUS

## 2016-05-08 MED ORDER — SOD CITRATE-CITRIC ACID 500-334 MG/5ML PO SOLN: 30.0000 mL | ORAL | Status: DC | PRN

## 2016-05-08 MED ORDER — ONDANSETRON HCL 4 MG/2ML IJ SOLN
4.0000 mg | Freq: Four times a day (QID) | INTRAMUSCULAR | Status: DC | PRN
  Filled 2016-05-08: qty 2

## 2016-05-08 MED ORDER — OXYTOCIN BOLUS FROM INFUSION
500.0000 mL | Freq: Once | INTRAVENOUS | Status: AC
  Administered 2016-05-08: 500 mL via INTRAVENOUS

## 2016-05-08 MED ORDER — OXYCODONE-ACETAMINOPHEN 5-325 MG PO TABS: 1.0000 | ORAL_TABLET | ORAL | Status: DC | PRN

## 2016-05-08 MED ORDER — BENZOCAINE-MENTHOL 20-0.5 % EX AERO: 1.0000 "application " | INHALATION_SPRAY | CUTANEOUS | Status: DC | PRN

## 2016-05-08 MED ORDER — DIPHENHYDRAMINE HCL 25 MG PO CAPS: 25.0000 mg | ORAL_CAPSULE | Freq: Four times a day (QID) | ORAL | Status: DC | PRN

## 2016-05-08 MED ORDER — IBUPROFEN 600 MG PO TABS
600.0000 mg | ORAL_TABLET | Freq: Four times a day (QID) | ORAL | Status: DC
  Administered 2016-05-08 – 2016-05-10 (×5): 600 mg via ORAL
  Filled 2016-05-08 (×7): qty 1

## 2016-05-08 MED ORDER — FENTANYL 2.5 MCG/ML BUPIVACAINE 1/10 % EPIDURAL INFUSION (WH - ANES): 14.0000 mL/h | INTRAMUSCULAR | Status: DC | PRN

## 2016-05-08 MED ORDER — SENNOSIDES-DOCUSATE SODIUM 8.6-50 MG PO TABS
2.0000 | ORAL_TABLET | ORAL | Status: DC
  Administered 2016-05-08 – 2016-05-10 (×2): 2 via ORAL
  Filled 2016-05-08 (×2): qty 2

## 2016-05-08 MED ORDER — ZOLPIDEM TARTRATE 5 MG PO TABS: 5.0000 mg | ORAL_TABLET | Freq: Every evening | ORAL | Status: DC | PRN

## 2016-05-08 MED ORDER — ALBUTEROL SULFATE (2.5 MG/3ML) 0.083% IN NEBU: 2.5000 mg | INHALATION_SOLUTION | RESPIRATORY_TRACT | Status: DC | PRN

## 2016-05-08 MED ORDER — DIBUCAINE 1 % RE OINT: 1.0000 "application " | TOPICAL_OINTMENT | RECTAL | Status: DC | PRN

## 2016-05-08 MED ORDER — OXYCODONE-ACETAMINOPHEN 5-325 MG PO TABS: 2.0000 | ORAL_TABLET | ORAL | Status: DC | PRN

## 2016-05-08 MED ORDER — ONDANSETRON HCL 4 MG PO TABS: 4.0000 mg | ORAL_TABLET | ORAL | Status: DC | PRN

## 2016-05-08 MED ORDER — PNEUMOCOCCAL VAC POLYVALENT 25 MCG/0.5ML IJ INJ
0.5000 mL | INJECTION | INTRAMUSCULAR | Status: DC
  Filled 2016-05-08 (×2): qty 0.5

## 2016-05-08 MED ORDER — LACTATED RINGERS IV SOLN: 500.0000 mL | INTRAVENOUS | Status: DC | PRN

## 2016-05-08 MED ORDER — SIMETHICONE 80 MG PO CHEW: 80.0000 mg | CHEWABLE_TABLET | ORAL | Status: DC | PRN

## 2016-05-08 MED ORDER — ONDANSETRON HCL 4 MG/2ML IJ SOLN: 4.0000 mg | INTRAMUSCULAR | Status: DC | PRN

## 2016-05-08 MED ORDER — FENTANYL CITRATE (PF) 100 MCG/2ML IJ SOLN
50.0000 ug | INTRAMUSCULAR | Status: DC | PRN
  Administered 2016-05-08 (×4): 100 ug via INTRAVENOUS
  Filled 2016-05-08 (×4): qty 2

## 2016-05-08 MED ORDER — OXYTOCIN 40 UNITS IN LACTATED RINGERS INFUSION - SIMPLE MED
2.5000 [IU]/h | INTRAVENOUS | Status: DC
  Filled 2016-05-08: qty 1000

## 2016-05-08 MED ORDER — LACTATED RINGERS IV SOLN: 500.0000 mL | Freq: Once | INTRAVENOUS | Status: DC

## 2016-05-08 MED ORDER — ACETAMINOPHEN 325 MG PO TABS: 650.0000 mg | ORAL_TABLET | ORAL | Status: DC | PRN

## 2016-05-08 MED ORDER — FERROUS SULFATE 325 (65 FE) MG PO TABS
325.0000 mg | ORAL_TABLET | Freq: Every day | ORAL | Status: DC
  Administered 2016-05-08 – 2016-05-10 (×3): 325 mg via ORAL
  Filled 2016-05-08 (×3): qty 1

## 2016-05-08 MED ORDER — DIPHENHYDRAMINE HCL 50 MG/ML IJ SOLN: 12.5000 mg | INTRAMUSCULAR | Status: DC | PRN

## 2016-05-08 MED ORDER — TETANUS-DIPHTH-ACELL PERTUSSIS 5-2.5-18.5 LF-MCG/0.5 IM SUSP
0.5000 mL | Freq: Once | INTRAMUSCULAR | Status: AC
  Administered 2016-05-10: 0.5 mL via INTRAMUSCULAR
  Filled 2016-05-08: qty 0.5

## 2016-05-08 MED ORDER — WITCH HAZEL-GLYCERIN EX PADS: 1.0000 "application " | MEDICATED_PAD | CUTANEOUS | Status: DC | PRN

## 2016-05-08 MED ORDER — COCONUT OIL OIL: 1.0000 "application " | TOPICAL_OIL | Status: DC | PRN

## 2016-05-08 MED ORDER — LACTATED RINGERS IV SOLN: INTRAVENOUS | Status: DC

## 2016-05-08 MED ORDER — LIDOCAINE HCL (PF) 1 % IJ SOLN
30.0000 mL | INTRAMUSCULAR | Status: AC | PRN
  Administered 2016-05-08: 30 mL via SUBCUTANEOUS
  Filled 2016-05-08: qty 30

## 2016-05-08 NOTE — H&P (Signed)
OBSTETRIC ADMISSION HISTORY AND PHYSICAL  Jodi Roth is a 20 y.o. female G1P0000 with IUP at [redacted]w[redacted]d by U/S presenting for SOL. She reports water broke at 0600 and she then went to MAU. +FMs, no VB, no blurry vision, headaches or peripheral edema, and RUQ pain.  She plans on breast and bottle feeding. She is not sure about birth control. Has tried depo in the past but states it made her sleep all the time.  Dating: By U/S --->  Estimated Date of Delivery: 05/23/16  Sono:    @[redacted]w[redacted]d , CWD, normal anatomy, cephlic presentation. EFW undetermined.    Prenatal History/Complications:  Past Medical History: Past Medical History:  Diagnosis Date  . Asthma     Past Surgical History: Past Surgical History:  Procedure Laterality Date  . NO PAST SURGERIES      Obstetrical History: OB History    Gravida Para Term Preterm AB Living   1 0 0 0 0 0   SAB TAB Ectopic Multiple Live Births   0 0 0 0        Social History: Social History   Social History  . Marital status: Single    Spouse name: N/A  . Number of children: N/A  . Years of education: N/A   Social History Main Topics  . Smoking status: Never Smoker  . Smokeless tobacco: Never Used  . Alcohol use No  . Drug use: No  . Sexual activity: Yes   Other Topics Concern  . None   Social History Narrative  . None    Family History: History reviewed. No pertinent family history.  Allergies: Allergies  Allergen Reactions  . Penicillins Anaphylaxis    Has patient had a PCN reaction causing immediate rash, facial/tongue/throat swelling, SOB or lightheadedness with hypotension:UNKNOWN Has patient had a PCN reaction causing severe rash involving mucus membranes or skin necrosis: UNKNOWNHas patient had a PCN reaction that required hospitalization UNKNOWN If all of the above answers are "NO", then may proceed with Cephalosporin use.    . Eggs Or Egg-Derived Products Nausea And Vomiting    Prescriptions Prior to  Admission  Medication Sig Dispense Refill Last Dose  . albuterol (PROVENTIL HFA;VENTOLIN HFA) 108 (90 Base) MCG/ACT inhaler Inhale 2 puffs into the lungs every 4 (four) hours as needed for wheezing or shortness of breath. 1 Inhaler 0 Taking  . cetirizine (ZYRTEC ALLERGY) 10 MG tablet Take 1 tablet (10 mg total) by mouth daily. 30 tablet 1 Taking  . ferrous sulfate 325 (65 FE) MG tablet Take 1 tablet by mouth daily. Reported on 02/07/2016  6 Taking  . NIFEdipine (PROCARDIA) 10 MG capsule    Taking  . predniSONE (DELTASONE) 20 MG tablet   0 Taking  . Prenat w/o A-FeCbGl-DSS-FA-DHA (CITRANATAL 90 DHA) 90-1 & 300 MG MISC TK 1 T AND 1 GEL CAPSULE PO UTD  6 Taking  . progesterone (PROMETRIUM) 200 MG capsule I 1 C VAG QD  0 Taking     Review of Systems   All systems reviewed and negative except as stated in HPI  Blood pressure 136/78, pulse 82, temperature 98.8 F (37.1 C), temperature source Oral, resp. rate 18, height 5\' 6"  (1.676 m), weight 80.3 kg (177 lb), last menstrual period 07/08/2015. General appearance: alert, cooperative and moderate distress Lungs: clear to auscultation bilaterally Heart: regular rate and rhythm Extremities: Homans sign is negative, no sign of DVT Presentation: undetermined Fetal monitoringBaseline: 120 bpm, Variability: Good {> 6 bpm) and Accelerations: Reactive Uterine activity  and ctxs since 0600. Frequency: Every 3 minutes Dilation: 6 Effacement (%): 100 Station: +1 Exam by:: Montez Morita, RNC   Prenatal labs: ABO, Rh: --/--/A NEG (05/21 1032) Antibody: POS (05/21 1032) Rubella:   RPR: Non Reactive (12/18 2053)  HBsAg:    HIV: Non Reactive (12/18 2053)  GBS:   neg 3 hr Glucola 213 Genetic screening  declined Anatomy US normal  Prenatal Transfer Tool  Maternal Diabetes: No Genetic Screening: Declined Maternal Ultrasounds/Referrals: Normal Fetal Ultrasounds or other Referrals:  None Maternal Substance Abuse:  No Significant Maternal Medications:   None Significant Maternal Lab Results: None  Results for orders placed or performed during the hospital encounter of 05/08/16 (from the past 24 hour(s))  Fern Test   Collection Time: 05/08/16  7:31 AM  Result Value Ref Range   POCT Fern Test Positive = ruptured amniotic membanes   Group B strep by PCR   Collection Time: 05/08/16  7:40 AM  Result Value Ref Range   Group B strep by PCR NEGATIVE NEGATIVE  CBC   Collection Time: 05/08/16  8:15 AM  Result Value Ref Range   WBC 11.4 (H) 4.0 - 10.5 K/uL   RBC 4.11 3.87 - 5.11 MIL/uL   Hemoglobin 11.9 (L) 12.0 - 15.0 g/dL   HCT 16.1 (L) 09.6 - 04.5 %   MCV 84.4 78.0 - 100.0 fL   MCH 29.0 26.0 - 34.0 pg   MCHC 34.3 30.0 - 36.0 g/dL   RDW 40.9 81.1 - 91.4 %   Platelets 201 150 - 400 K/uL    Patient Active Problem List   Diagnosis Date Noted  . Active labor at term 05/08/2016  . Supervision of normal pregnancy in third trimester 04/18/2016    Assessment/Plan:  Jodi Roth is a 20 y.o. G1P0 at [redacted]w[redacted]d here for labor. Pregnancy has been uncomplicated. Hx of asthma.   #Labor: expectant management  #Pain: IV fentanyl  #FWB: Cat 1 #ID:  GBS neg #MOF: breast and bottle #MOC:undecided #Circ:  n/a  Maryelizabeth Kaufmann, Medical Student  05/08/2016, 9:31 AM   CNM attestation:  Jodi Roth is a 20 y.o. G1P1001 here for SROM/early labor  PE: BP (!) 123/58   Pulse 71   Temp 97.8 F (36.6 C) (Oral)   Resp 20   Ht  (1.676 m)   Wt 80.3 kg (177 lb)   LMP 07/08/2015   Breastfeeding? Unknown   BMI 28.57 kg/m  Gen: calm comfortable, NAD Resp: normal effort, no distress Abd: gravid  ROS, labs, PMH reviewed  Plan: Admit to [facility] Expectant management Anticipate SVD  Cam Hai CNM 05/08/2016, 4:18 PM

## 2016-05-08 NOTE — Lactation Note (Signed)
This note was copied from a baby's chart. Lactation Consultation Note  Patient Name: Girl Tacia Smirl UVOZD'G Date: 05/08/2016 Reason for consult: Initial assessment Baby at 3 hr of life. Mom was in the bathroom. Left handouts and instructions with support person for mom to call at next bf.   Maternal Data    Feeding Feeding Type: Formula Nipple Type: Slow - flow  LATCH Score/Interventions Latch: Repeated attempts needed to sustain latch, nipple held in mouth throughout feeding, stimulation needed to elicit sucking reflex. Intervention(s): Adjust position;Assist with latch;Breast massage  Audible Swallowing: A few with stimulation Intervention(s): Skin to skin  Type of Nipple: Everted at rest and after stimulation  Comfort (Breast/Nipple): Soft / non-tender     Hold (Positioning): Assistance needed to correctly position infant at breast and maintain latch. Intervention(s): Breastfeeding basics reviewed;Position options;Support Pillows;Skin to skin  LATCH Score: 7  Lactation Tools Discussed/Used     Consult Status Consult Status: Follow-up Date: 05/08/16 Follow-up type: In-patient    Rulon Eisenmenger 05/08/2016, 5:28 PM

## 2016-05-08 NOTE — Lactation Note (Signed)
This note was copied from a baby's chart. Lactation Consultation Note  Patient Name: Girl Braxtyn Knotek NIDPO'E Date: 05/08/2016 Reason for consult: Follow-up assessment Baby at 6 hr of life. Mom stated that she no longer wants to latch the baby. She said "I don't have the patients for this". She wants to pump to feed but she is not going to start pumping until her milk comes in. Discussed bringing in an abundant milk supply along with supply and demand. She understands breast changes. She is aware of lactation services and support group.l She will call as needed.   Maternal Data    Feeding Feeding Type: Breast Fed  LATCH Score/Interventions Latch: Repeated attempts needed to sustain latch, nipple held in mouth throughout feeding, stimulation needed to elicit sucking reflex. Intervention(s): Adjust position;Assist with latch;Breast massage;Breast compression  Audible Swallowing: None Intervention(s): Skin to skin;Hand expression Intervention(s): Skin to skin;Hand expression;Alternate breast massage  Type of Nipple: Everted at rest and after stimulation  Comfort (Breast/Nipple): Soft / non-tender     Hold (Positioning): Assistance needed to correctly position infant at breast and maintain latch. Intervention(s): Breastfeeding basics reviewed;Support Pillows;Position options;Skin to skin  LATCH Score: 6  Lactation Tools Discussed/Used     Consult Status Consult Status: Complete Date: 05/08/16 Follow-up type: In-patient    Rulon Eisenmenger 05/08/2016, 8:34 PM

## 2016-05-08 NOTE — MAU Note (Signed)
Pt c/o LOF at 0600-clear fluid. Contractions. Was 2.5cm yesterday.

## 2016-05-08 NOTE — Anesthesia Pain Management Evaluation Note (Signed)
  CRNA Pain Management Visit Note  Patient: Jodi Roth, 20 y.o., female  "Hello I am a member of the anesthesia team at [facility]. We have an anesthesia team available at all times to provide care throughout the hospital, including epidural management and anesthesia for C-section. I don't know your plan for the delivery whether it a natural birth, water birth, IV sedation, nitrous supplementation, doula or epidural, but we want to meet your pain goals."   1.Was your pain managed to your expectations on prior hospitalizations?   Yes   2.What is your expectation for pain management during this hospitalization?     IV pain meds  3.How can we help you reach that goal?   Record the patient's initial score and the patient's pain goal.   Pain: 10  Pain Goal: Patient sleeping - unable to assess [facility] wants you to be able to say your pain was always managed very well.  Cephus Shelling 05/08/2016

## 2016-05-08 NOTE — MAU Note (Signed)
Patient in BR. Vomited. Leaking clear fluid. Fern pending.

## 2016-05-09 LAB — RPR: RPR: NONREACTIVE

## 2016-05-09 MED ORDER — RHO D IMMUNE GLOBULIN 1500 UNIT/2ML IJ SOSY
300.0000 ug | PREFILLED_SYRINGE | Freq: Once | INTRAMUSCULAR | Status: AC
  Administered 2016-05-09: 300 ug via INTRAVENOUS
  Filled 2016-05-09: qty 2

## 2016-05-09 NOTE — Clinical Social Work Maternal (Signed)
  CLINICAL SOCIAL WORK MATERNAL/CHILD NOTE  Patient Details  Name: Jodi Roth MRN: [id number] Date of Birth: [birth year]  Date:  05/09/2016  Clinical Social Worker Initiating Note:  Laurey Arrow Date/ Time Initiated:  05/09/16/1235     Child's Name:  Sherrie Sport   Legal Guardian:  Mother   Need for Interpreter:  None   Date of Referral:  05/09/16     Reason for Referral:  Late or No Prenatal Care    Referral Source:  Central Nursery   Address:  [address]  Phone number:  [phone]   Household Members:  Self   Natural Supports (not living in the home):  Extended Family, Immediate Family, Parent   Professional Supports: None   Employment: Part-time   Type of Work: Engineer, structural   Education:  Database administrator Resources:      Other Resources:  [facility]   Cultural/Religious Considerations Which May Impact Care:  None Reported  Strengths:  Ability to meet basic needs , Home prepared for child    Risk Factors/Current Problems:  None   Cognitive State:  Alert , Insightful    Mood/Affect:  Comfortable , Calm , Happy , Interested    CSW Assessment: CSW met with MOB to complete a consult for [facility].  MOB was inviting and polite.  MOB introduced her room guest as cousin Mart Piggs [place]), and gave CSW permission to speak with MOB while guest was present.  CSW inquired about MOB's LPNC. MOB informed CSW that MOB initiated [facility] at 7 weeks with Dr. Marcheta Grammes office.  MOB also reported MOB received care with Dr. Jacelyn Grip office after Dr. Ruthann Cancer retired.  MOB was adamant that MOB never missed a prenatal appointment.  CSW thanked MOB for the information and informed MOB that MOB's records were probably not transferred to the hospital.  MOB completely understood. CSW informed MOB of the hospital's policy and procedure regarding [facility] and drug screens for the infant.  MOB had no questions or concerns.  CSW thanked MOB for meeting with CSW.   CSW apologized again to MOB if in fact MOB's [facility] records were not transferred to the hospital.    CSW Plan/Description:  Patient/Family Education , No Further Intervention Required/No Barriers to Discharge   Laurey Arrow, MSW, LCSW Clinical Social Work [phone]  Dimple Nanas, LCSW 05/09/2016, 12:37 PM

## 2016-05-09 NOTE — Progress Notes (Signed)
Post Partum Day #1 Subjective: no complaints and tolerating PO; bottlefeeding now; undecided on contraception  Objective: Blood pressure 128/67, pulse 87, temperature 98.4 F (36.9 C), temperature source Oral, resp. rate 18, height 5\' 6"  (1.676 m), weight 80.3 kg (177 lb), last menstrual period 07/08/2015, unknown if currently breastfeeding.  Physical Exam:  General: alert, cooperative and no distress Lochia: appropriate Uterine Fundus: firm DVT Evaluation: No evidence of DVT seen on physical exam.   Recent Labs  05/08/16 0815  HGB 11.9*  HCT 34.7*    Assessment/Plan: Plan for discharge tomorrow   LOS: 1 day   Benjerman Molinelli CNM 05/09/2016, 9:00 AM

## 2016-05-10 LAB — RH IG WORKUP (INCLUDES ABO/RH)
ABO/RH(D): A NEG
FETAL SCREEN: NEGATIVE
GESTATIONAL AGE(WKS): 37.6
Unit division: 0

## 2016-05-10 MED ORDER — IBUPROFEN 600 MG PO TABS: 600.0000 mg | ORAL_TABLET | Freq: Four times a day (QID) | ORAL | 0 refills | Status: DC | PRN

## 2016-05-10 NOTE — Progress Notes (Signed)
Patient educated on the importance of not co- bedding with baby girl. Patient acknowledged her understanding.

## 2016-05-10 NOTE — Discharge Instructions (Signed)

## 2016-05-10 NOTE — Discharge Summary (Signed)
OB Discharge Summary     Patient Name: Jodi Roth DOB: 12-13-1995 MRN: [id number]  Date of admission: 05/08/2016 Delivering MD: Garth Bigness   Date of discharge: 05/10/2016  Admitting diagnosis: 38 WEEKS LEAKING FLUID PRESURE Intrauterine pregnancy: [redacted]w[redacted]d     Secondary diagnosis:  Active Problems:   Active labor at term  Additional problems: Rh neg     Discharge diagnosis: Term Pregnancy Delivered                                                                                                Post partum procedures:rhogam  Augmentation: none  Complications: None  Hospital course:  Onset of Labor With Vaginal Delivery     20 y.o. yo G1P1001 at [redacted]w[redacted]d was admitted in Active Labor on 05/08/2016. Patient had an uncomplicated labor course as follows:  Membrane Rupture Time/Date: 6:00 AM ,05/08/2016   Intrapartum Procedures: Episiotomy: None [1]                                         Lacerations:  1st degree [2];Periurethral [8]  Patient had a delivery of a Viable infant. 05/08/2016  Information for the patient's newborn:  Landrey, Bernal [[id number]]  Delivery Method: Vag-Spont    Pateint had an uncomplicated postpartum course.  She is ambulating, tolerating a regular diet, passing flatus, and urinating well. Patient is discharged home in stable condition on 05/10/16.    Physical exam Vitals:   05/09/16 0532 05/09/16 1830 05/10/16 0100 05/10/16 0600  BP: 128/67 (!) 117/56 111/63 124/70  Pulse: 87 86 71 62  Resp: 18 17 16 16   Temp: 98.4 F (36.9 C) 98.8 F (37.1 C) 99.2 F (37.3 C) 98.4 F (36.9 C)  TempSrc: Oral Oral Oral Oral  Weight:      Height:       General: alert and cooperative Lochia: appropriate Uterine Fundus: firm Incision: N/A DVT Evaluation: No evidence of DVT seen on physical exam. Labs: Lab Results  Component Value Date   WBC 11.4 (H) 05/08/2016   HGB 11.9 (L) 05/08/2016   HCT 34.7 (L) 05/08/2016   MCV 84.4 05/08/2016   PLT 201  05/08/2016   CMP Latest Ref Rng & Units 11/28/2015  Glucose 65 - 99 mg/dL 88  BUN 6 - 20 mg/dL 7  Creatinine 2.58 - 5.27 mg/dL 7.82  Sodium 423 - 536 mmol/L 136  Potassium 3.5 - 5.1 mmol/L 4.0  Chloride 101 - 111 mmol/L 106  CO2 22 - 32 mmol/L 21(L)  Calcium 8.9 - 10.3 mg/dL 9.3  Total Protein 6.5 - 8.1 g/dL 6.7  Total Bilirubin 0.3 - 1.2 mg/dL 0.5  Alkaline Phos 38 - 126 U/L 33(L)  AST 15 - 41 U/L 17  ALT 14 - 54 U/L 12(L)    Discharge instruction: per After Visit Summary and "Baby and Me Booklet".  After visit meds:    Medication List    TAKE these medications   albuterol 108 (90 Base) MCG/ACT inhaler Commonly known as:  PROVENTIL HFA;VENTOLIN HFA Inhale 2 puffs into the lungs every 4 (four) hours as needed for wheezing or shortness of breath.   cetirizine 10 MG tablet Commonly known as:  ZYRTEC ALLERGY Take 1 tablet (10 mg total) by mouth daily.   ferrous sulfate 325 (65 FE) MG tablet Take 1 tablet by mouth daily. Reported on 02/07/2016   ibuprofen 600 MG tablet Commonly known as:  ADVIL,MOTRIN Take 1 tablet (600 mg total) by mouth every 6 (six) hours as needed.   prenatal multivitamin Tabs tablet Take 1 tablet by mouth daily at 12 noon.       Diet: routine diet  Activity: Advance as tolerated. Pelvic rest for 6 weeks.   Outpatient follow up:6 weeks Follow up Appt:Future Appointments Date Time Provider Department Center  05/15/2016 8:45 AM Willodean Rosenthal, MD [facility]   Follow up Visit:No Follow-up on file.  Postpartum contraception: Undecided  Newborn Data: Live born female  Birth Weight: 5 lb 4.3 oz (2390 g) APGAR: 8, 9  Baby Feeding: Bottle Disposition:home with mother   05/10/2016 Cam Hai, CNM  8:08 AM

## 2016-05-10 NOTE — Lactation Note (Signed)
This note was copied from a baby's chart. Lactation Consultation NoteMother states that she plans to start pumping breast when she arrives home. She states that her father is going t buy her a breast pump to day. Mother has insurance that provides for a pump. Discussed the 2 week rental . Mother declined. She state she is going to use the harmony hand pump until she get electric. Advised mother to massage breast and ice to prevent severe engorgement. Mother 's breast are firm and filling . Mother states that she may start to breastfeed infant after her milk comes in good. Lots of teaching with mother.   Patient Name: Jodi Roth RCVEL'F Date: 05/10/2016 Reason for consult: Follow-up assessment   Maternal Data Has patient been taught Hand Expression?: Yes Does the patient have breastfeeding experience prior to this delivery?: No  Feeding    LATCH Score/Interventions                      Lactation Tools Discussed/Used     Consult Status      Jodi Roth 05/10/2016, 10:55 AM

## 2016-05-11 LAB — STREP GP B CULTURE+RFLX: STREP GP B CULTURE+RFLX: NEGATIVE

## 2016-05-14 NOTE — Progress Notes (Signed)
Post discharge chart review completed.  

## 2016-05-15 ENCOUNTER — Encounter: Payer: Medicaid Other | Admitting: Obstetrics & Gynecology

## 2016-06-12 ENCOUNTER — Ambulatory Visit (INDEPENDENT_AMBULATORY_CARE_PROVIDER_SITE_OTHER): Payer: BLUE CROSS/BLUE SHIELD | Admitting: Obstetrics and Gynecology

## 2016-06-12 ENCOUNTER — Encounter: Payer: Self-pay | Admitting: *Deleted

## 2016-06-12 ENCOUNTER — Encounter: Payer: Self-pay | Admitting: Obstetrics and Gynecology

## 2016-06-12 DIAGNOSIS — Z029 Encounter for administrative examinations, unspecified: Secondary | ICD-10-CM

## 2016-06-12 DIAGNOSIS — Z30013 Encounter for initial prescription of injectable contraceptive: Secondary | ICD-10-CM | POA: Insufficient documentation

## 2016-06-12 DIAGNOSIS — Z309 Encounter for contraceptive management, unspecified: Secondary | ICD-10-CM

## 2016-06-12 MED ORDER — MEDROXYPROGESTERONE ACETATE 150 MG/ML IM SUSP: 150.0000 mg | INTRAMUSCULAR | 0 refills | Status: DC

## 2016-06-12 NOTE — Progress Notes (Signed)
Subjective:     Jodi Roth is a 20 y.o. female who presents for a postpartum visit. She is5  weeks postpartum following a spontaneous vaginal delivery. I have fully reviewed the prenatal and intrapartum course. The delivery was at 37 6/7 gestational weeks. Outcome: spontaneous vaginal delivery. Anesthesia: epidural. Postpartum course has been unremarkable. Baby's course has been unremarkable. Baby is feeding by bottle - Similac Advance. Bleeding currently on cycle today. Bowel function is normal. Bladder function is normal. Patient is not sexually active. Contraception method is none. Postpartum depression screening: negative.  The following portions of the patient's history were reviewed and updated as appropriate: allergies, past family history, past medical history, past social history, past surgical history and problem list.  Review of Systems Pertinent items are noted in HPI.   Objective:    BP 123/73   Pulse 67   Temp 98.6 F (37 C) (Oral)   Wt 156 lb 14.4 oz (71.2 kg)   LMP 06/10/2016   Breastfeeding? No   BMI 25.32 kg/m   General:  alert   Breasts:  inspection negative, no nipple discharge or bleeding, no masses or nodularity palpable and deferred  Lungs: clear to auscultation bilaterally  Heart:  regular rate and rhythm, S1, S2 normal, no murmur, click, rub or gallop  Abdomen: soft, non-tender; bowel sounds normal; no masses,  no organomegaly   Vulva:  normal  Vagina: normal vagina  Cervix:  menese nnoted  Corpus: normal size, contour, position, consistency, mobility, non-tender  Adnexa:  normal adnexa  Rectal Exam: Not performed.        Assessment:     Normal postpartum exam.   Pt to receive depo Provera x 1 as she thinks further about her contraception choices. IUD information provided to pt. Return to work note for 06/13/16 provided as well.  Plan:    See Above

## 2016-06-13 ENCOUNTER — Ambulatory Visit: Payer: Medicaid Other

## 2016-06-13 ENCOUNTER — Encounter: Payer: Self-pay | Admitting: *Deleted

## 2016-06-19 ENCOUNTER — Ambulatory Visit: Payer: Medicaid Other | Admitting: Obstetrics and Gynecology

## 2017-03-16 ENCOUNTER — Inpatient Hospital Stay (HOSPITAL_COMMUNITY)
Admission: AD | Admit: 2017-03-16 | Discharge: 2017-03-16 | Disposition: A | Discharge status: Home / Self Care | Payer: BLUE CROSS/BLUE SHIELD | Source: Ambulatory Visit | Attending: Obstetrics & Gynecology | Admitting: Obstetrics & Gynecology

## 2017-03-16 ENCOUNTER — Encounter (HOSPITAL_COMMUNITY): Payer: Self-pay

## 2017-03-16 DIAGNOSIS — J45909 Unspecified asthma, uncomplicated: Secondary | ICD-10-CM | POA: Insufficient documentation

## 2017-03-16 DIAGNOSIS — Z880 Allergy status to penicillin: Secondary | ICD-10-CM | POA: Insufficient documentation

## 2017-03-16 DIAGNOSIS — N760 Acute vaginitis: Secondary | ICD-10-CM | POA: Insufficient documentation

## 2017-03-16 DIAGNOSIS — B9689 Other specified bacterial agents as the cause of diseases classified elsewhere: Secondary | ICD-10-CM | POA: Insufficient documentation

## 2017-03-16 HISTORY — DX: Acute vaginitis: B96.89

## 2017-03-16 HISTORY — DX: Acute vaginitis: N76.0

## 2017-03-16 LAB — URINALYSIS, ROUTINE W REFLEX MICROSCOPIC
Bilirubin Urine: NEGATIVE
GLUCOSE, UA: NEGATIVE mg/dL
Hgb urine dipstick: NEGATIVE
KETONES UR: NEGATIVE mg/dL
LEUKOCYTES UA: NEGATIVE
NITRITE: NEGATIVE
PROTEIN: NEGATIVE mg/dL
Specific Gravity, Urine: 1.02 (ref 1.005–1.030)
pH: 6.5 (ref 5.0–8.0)

## 2017-03-16 LAB — WET PREP, GENITAL
Sperm: NONE SEEN
Trich, Wet Prep: NONE SEEN
YEAST WET PREP: NONE SEEN

## 2017-03-16 LAB — POCT PREGNANCY, URINE: Preg Test, Ur: NEGATIVE

## 2017-03-16 MED ORDER — METRONIDAZOLE 500 MG PO TABS: 500.0000 mg | ORAL_TABLET | Freq: Two times a day (BID) | ORAL | 0 refills | Status: AC

## 2017-03-16 NOTE — Discharge Instructions (Signed)

## 2017-03-16 NOTE — MAU Note (Signed)
Patient presents with vaginal pain x 1 week, and vaginal discharge x 2 weeks.

## 2017-03-16 NOTE — MAU Provider Note (Signed)
History     CSN: [id number]  Arrival date and time: 03/16/17 1548   None     Chief Complaint  Patient presents with  . Vaginal Pain  . Vaginal Discharge   Vaginal Discharge  The patient's primary symptoms include genital itching, a genital odor and vaginal discharge. The patient's pertinent negatives include no genital lesions, genital rash, missed menses, pelvic pain or vaginal bleeding. This is a new problem. The current episode started in the past 7 days. The problem occurs constantly. The problem has been unchanged. The pain is mild. She is not pregnant. Pertinent negatives include no abdominal pain, anorexia, back pain, chills, constipation, diarrhea, discolored urine, dysuria, fever, flank pain, frequency, headaches, hematuria, joint swelling, nausea, painful intercourse, rash, sore throat, urgency or vomiting. The vaginal discharge was white and thin. There has been no bleeding. She has not been passing clots. She has not been passing tissue. Nothing aggravates the symptoms. She has tried nothing for the symptoms. The treatment provided no relief. No, her partner does not have an STD.    OB History    Gravida Para Term Preterm AB Living   1 1 1  0 0 1   SAB TAB Ectopic Multiple Live Births   0 0 0 0 1      Past Medical History:  Diagnosis Date  . Asthma   . BV (bacterial vaginosis)     Past Surgical History:  Procedure Laterality Date  . NO PAST SURGERIES      History reviewed. No pertinent family history.  Social History  Substance Use Topics  . Smoking status: Never Smoker  . Smokeless tobacco: Never Used  . Alcohol use No    Allergies:  Allergies  Allergen Reactions  . Penicillins Anaphylaxis    Has patient had a PCN reaction causing immediate rash, facial/tongue/throat swelling, SOB or lightheadedness with hypotension:UNKNOWN Has patient had a PCN reaction causing severe rash involving mucus membranes or skin necrosis: UNKNOWNHas patient had a PCN  reaction that required hospitalization UNKNOWN If all of the above answers are "NO", then may proceed with Cephalosporin use.    . Eggs Or Egg-Derived Products Nausea And Vomiting    Prescriptions Prior to Admission  Medication Sig Dispense Refill Last Dose  . albuterol (PROVENTIL HFA;VENTOLIN HFA) 108 (90 Base) MCG/ACT inhaler Inhale 2 puffs into the lungs every 4 (four) hours as needed for wheezing or shortness of breath. 1 Inhaler 0 Taking  . cetirizine (ZYRTEC ALLERGY) 10 MG tablet Take 1 tablet (10 mg total) by mouth daily. 30 tablet 1 Taking  . ferrous sulfate 325 (65 FE) MG tablet Take 1 tablet by mouth daily. Reported on 02/07/2016  6 Taking  . medroxyPROGESTERone (DEPO-PROVERA) 150 MG/ML injection Inject 1 mL (150 mg total) into the muscle every 3 (three) months. 1 mL 0   . Prenatal Vit-Fe Fumarate-FA (PRENATAL MULTIVITAMIN) TABS tablet Take 1 tablet by mouth daily at 12 noon.   Taking    Review of Systems  Constitutional: Negative for chills and fever.  HENT: Negative for sore throat.   Gastrointestinal: Negative for abdominal pain, anorexia, constipation, diarrhea, nausea and vomiting.  Genitourinary: Positive for vaginal discharge. Negative for dysuria, flank pain, frequency, hematuria, missed menses, pelvic pain and urgency.  Musculoskeletal: Negative for back pain.  Skin: Negative for rash.  Neurological: Negative for headaches.   Physical Exam   Blood pressure 114/75, pulse 86, temperature 98.6 F (37 C), resp. rate 18, height 5\' 6"  (1.676 m), weight 147 lb (  66.7 kg), last menstrual period 03/08/2017, not currently breastfeeding.  Physical Exam  Constitutional: She is oriented to person, place, and time. She appears well-developed and well-nourished.  HENT:  Head: Normocephalic and atraumatic.  Cardiovascular: Normal rate and intact distal pulses.   Respiratory: Effort normal. No respiratory distress.  GI: Soft. She exhibits no distension. There is no tenderness.  There is no rebound and no guarding.  Genitourinary:  Genitourinary Comments: Copious amounts of thin white vaginal discharge cervix closed no cervical motion tenderness healthy pink vaginal mucosal.  Musculoskeletal: Normal range of motion. She exhibits no edema.  Neurological: She is alert and oriented to person, place, and time.  Skin: Skin is warm and dry.  Psychiatric: She has a normal mood and affect. Her behavior is normal.    MAU Course  Procedures  MDM In MA U patient underwent pelvic exam which was only remarkable for thin white discharge. Wet prep was collected revealed bacterial vaginosis. Gonorrhea and chlamydia pending. Urinalysis unremarkable.  Assessment and Plan  #1: Bacterial vaginosis treated with metronidazole 7 days okay for DC home  Ernestina Penna 03/16/2017, 4:15 PM

## 2017-03-17 LAB — GC/CHLAMYDIA PROBE AMP (~~LOC~~) NOT AT ARMC
CHLAMYDIA, DNA PROBE: POSITIVE — AB
NEISSERIA GONORRHEA: NEGATIVE

## 2017-03-18 ENCOUNTER — Other Ambulatory Visit: Payer: Self-pay | Admitting: Nurse Practitioner

## 2017-03-18 MED ORDER — AZITHROMYCIN 250 MG PO TABS: 1000.0000 mg | ORAL_TABLET | Freq: Once | ORAL | Status: DC

## 2017-03-18 NOTE — Progress Notes (Signed)
Lab reviewed.  Positive chlamydia.  Medication prescribed to her pharmacy.  Nolene Bernheimerri Burleson, NP

## 2017-07-03 ENCOUNTER — Encounter (HOSPITAL_COMMUNITY): Payer: Self-pay | Admitting: Emergency Medicine

## 2017-07-03 ENCOUNTER — Emergency Department (HOSPITAL_COMMUNITY): Payer: Self-pay

## 2017-07-03 ENCOUNTER — Emergency Department (HOSPITAL_COMMUNITY)
Admission: EM | Admit: 2017-07-03 | Discharge: 2017-07-03 | Disposition: A | Discharge status: Home / Self Care | Payer: Self-pay | Attending: Emergency Medicine | Admitting: Emergency Medicine

## 2017-07-03 DIAGNOSIS — J069 Acute upper respiratory infection, unspecified: Secondary | ICD-10-CM | POA: Insufficient documentation

## 2017-07-03 DIAGNOSIS — J45909 Unspecified asthma, uncomplicated: Secondary | ICD-10-CM | POA: Insufficient documentation

## 2017-07-03 DIAGNOSIS — B9789 Other viral agents as the cause of diseases classified elsewhere: Secondary | ICD-10-CM | POA: Insufficient documentation

## 2017-07-03 MED ORDER — ALBUTEROL SULFATE (2.5 MG/3ML) 0.083% IN NEBU
5.0000 mg | INHALATION_SOLUTION | Freq: Once | RESPIRATORY_TRACT | Status: AC
  Administered 2017-07-03: 5 mg via RESPIRATORY_TRACT
  Filled 2017-07-03: qty 6

## 2017-07-03 MED ORDER — ALBUTEROL SULFATE HFA 108 (90 BASE) MCG/ACT IN AERS
1.0000 | INHALATION_SPRAY | Freq: Once | RESPIRATORY_TRACT | Status: AC
  Administered 2017-07-03: 1 via RESPIRATORY_TRACT
  Filled 2017-07-03: qty 6.7

## 2017-07-03 MED ORDER — PREDNISONE 10 MG PO TABS: 60.0000 mg | ORAL_TABLET | Freq: Every day | ORAL | 0 refills | Status: DC

## 2017-07-03 NOTE — ED Provider Notes (Signed)
[facility]-EMERGENCY DEPT Provider Note   CSN: [id number] Arrival date & time: 07/03/17  0757     History   Chief Complaint Chief Complaint  Patient presents with  . Asthma  . URI    HPI Jodi Roth is a 21 y.o. female.  HPI  SUBJECTIVE:  Jodi Roth is a 21 y.o. female who complains of congestion, sore throat, post nasal drip, productive cough, cough described as productive of yellow sputum and headache for 7 days. She denies a history of chills and fevers, chills and has a history of asthma. Patient denies smoke cigarettes.      Past Medical History:  Diagnosis Date  . Asthma   . BV (bacterial vaginosis)     Patient Active Problem List   Diagnosis Date Noted  . Encounter for initial prescription of injectable contraceptive 06/12/2016  . Postpartum care following vaginal delivery 06/12/2016    Past Surgical History:  Procedure Laterality Date  . NO PAST SURGERIES      OB History    Gravida Para Term Preterm AB Living   0 0 1   SAB TAB Ectopic Multiple Live Births   0 0 0 0 1       Home Medications    Prior to Admission medications   Medication Sig Start Date End Date Taking? Authorizing Provider  albuterol (PROVENTIL HFA;VENTOLIN HFA) 108 (90 Base) MCG/ACT inhaler Inhale 2 puffs into the lungs every 4 (four) hours as needed for wheezing or shortness of breath. 02/07/16   Danelle Berry, PA-C  Biotin 5 MG TABS Take 1 tablet by mouth daily.    [provider]  predniSONE (DELTASONE) 10 MG tablet Take 6 tablets (60 mg total) by mouth daily. 07/03/17   Derwood Kaplan, MD    Family History No family history on file.  Social History Social History  Substance Use Topics  . Smoking status: Never Smoker  . Smokeless tobacco: Never Used  . Alcohol use No     Allergies   Penicillins and Eggs or egg-derived products   Review of Systems Review of Systems  Constitutional: Negative for fever.  HENT: Positive for congestion, postnasal  drip, rhinorrhea and sinus pressure.   Respiratory: Positive for cough and shortness of breath.   Cardiovascular: Positive for chest pain.     Physical Exam Updated Vital Signs BP 109/71 (BP Location: Left Arm)   Pulse 74   Temp 98.5 F (36.9 C) (Oral)   Resp 18   Ht  (1.676 m)   Wt 65.8 kg (145 lb)   LMP 06/11/2017   SpO2 97%   BMI 23.40 kg/m   Physical Exam  Constitutional: She is oriented to person, place, and time. She appears well-developed.  HENT:  Head: Normocephalic and atraumatic.  Eyes: EOM are normal.  Neck: Normal range of motion. Neck supple.  Cardiovascular: Normal rate.   Pulmonary/Chest: Effort normal.  Abdominal: Bowel sounds are normal.  Neurological: She is alert and oriented to person, place, and time.  Skin: Skin is warm and dry.  Nursing note and vitals reviewed.    ED Treatments / Results  Labs (all labs ordered are listed, but only abnormal results are displayed) Labs Reviewed - No data to display  EKG  EKG Interpretation None       Radiology Dg Chest 2 View  Result Date: 07/03/2017 CLINICAL DATA:  Cough, chest congestion, and worsening shortness of breath over the past week. History of asthma. Nonsmoker. EXAM: CHEST  2 VIEW COMPARISON:  PA and lateral chest x-ray of November 12, 2014 FINDINGS: The lungs are mildly hyper inflated. Slightly increased lung markings are noted in the retrocardiac region today. These are difficult to triangulate on the frontal radiograph but likely lie on the left. The heart and pulmonary vascularity are normal. The mediastinum is normal in width. There is no pleural effusion. There is mild S shaped thoracolumbar curvature which is stable. IMPRESSION: Subtle increased density in the left lower lobe compatible with subsegmental atelectasis or very early pneumonia. Underlying reactive airway disease. Electronically Signed   By: David  [place] M.D.   On: 07/03/2017 09:01    Procedures Procedures (including  critical care time)  Medications Ordered in ED Medications  albuterol (PROVENTIL HFA;VENTOLIN HFA) 108 (90 Base) MCG/ACT inhaler 1 puff (not administered)  albuterol (PROVENTIL) (2.5 MG/3ML) 0.083% nebulizer solution 5 mg (5 mg Nebulization Given 07/03/17 0830)     Initial Impression / Assessment and Plan / ED Course  I have reviewed the triage vital signs and the nursing notes.  Pertinent labs & imaging results that were available during my care of the patient were reviewed by me and considered in my medical decision making (see chart for details).    Pt comes in with URI like symptoms and cough with chest pain and dib. She has hx of asthma and was wheezing, s/p nebs that cleared up the wheez. Clinically, she has a viral uri with asthma exacerbation, and we will tx as such, especially since the lungs have no focal findings, pt has no fevers and the constellation of symptoms are not consistent with a pneumonia.  Final Clinical Impressions(s) / ED Diagnoses   Final diagnoses:  Viral URI with cough    New Prescriptions New Prescriptions   PREDNISONE (DELTASONE) 10 MG TABLET    Take 6 tablets (60 mg total) by mouth daily.     Derwood Kaplan, MD 07/03/17 1116

## 2017-07-03 NOTE — ED Triage Notes (Signed)
Patient report having asthma and been out of her inhaler for about 6 months and has no PCP to follow up with,  Patient reports having issues with her asthma and URI symptoms for weeks now. Cough is sometimes productive with yellow phlegm.

## 2017-07-03 NOTE — Discharge Instructions (Signed)
We saw you in the ER for your breathing related complains. We gave you some breathing treatments in the ER, and seems like your symptoms have improved. °Please take albuterol as needed every 4 hours. °Please take the medications prescribed. °Please refrain from smoking or smoke exposure. °Please see a primary care doctor in 1 week. °Return to the ER if your symptoms worsen. ° °

## 2018-01-13 IMAGING — CR DG CHEST 2V
2 series · 2 of 2 positions shown · non-contrast
Comparison: PA and lateral chest x-ray November 12, 2014

CLINICAL DATA: Cough, chest congestion, and worsening shortness of
breath over the past week. History of asthma. Nonsmoker.

EXAM:
CHEST  2 VIEW

[w chest pa]
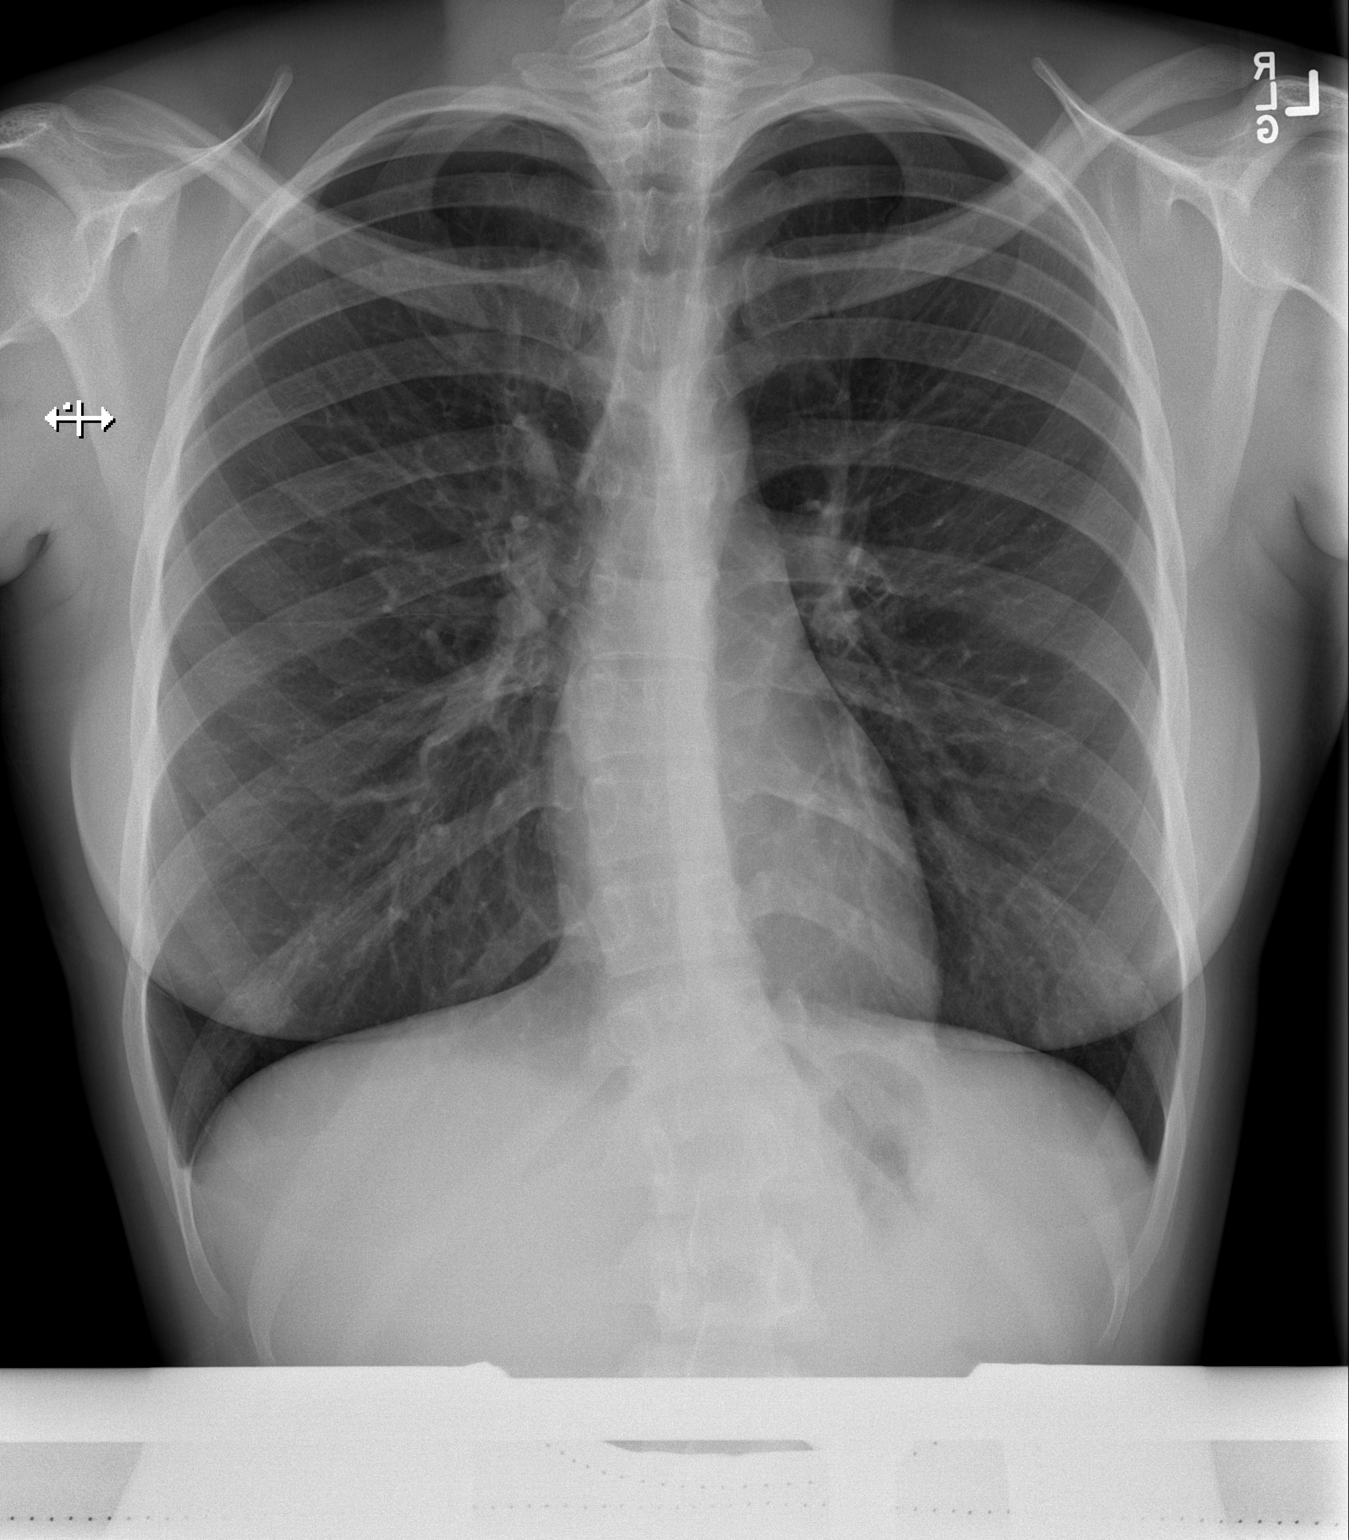

[w chest lat]
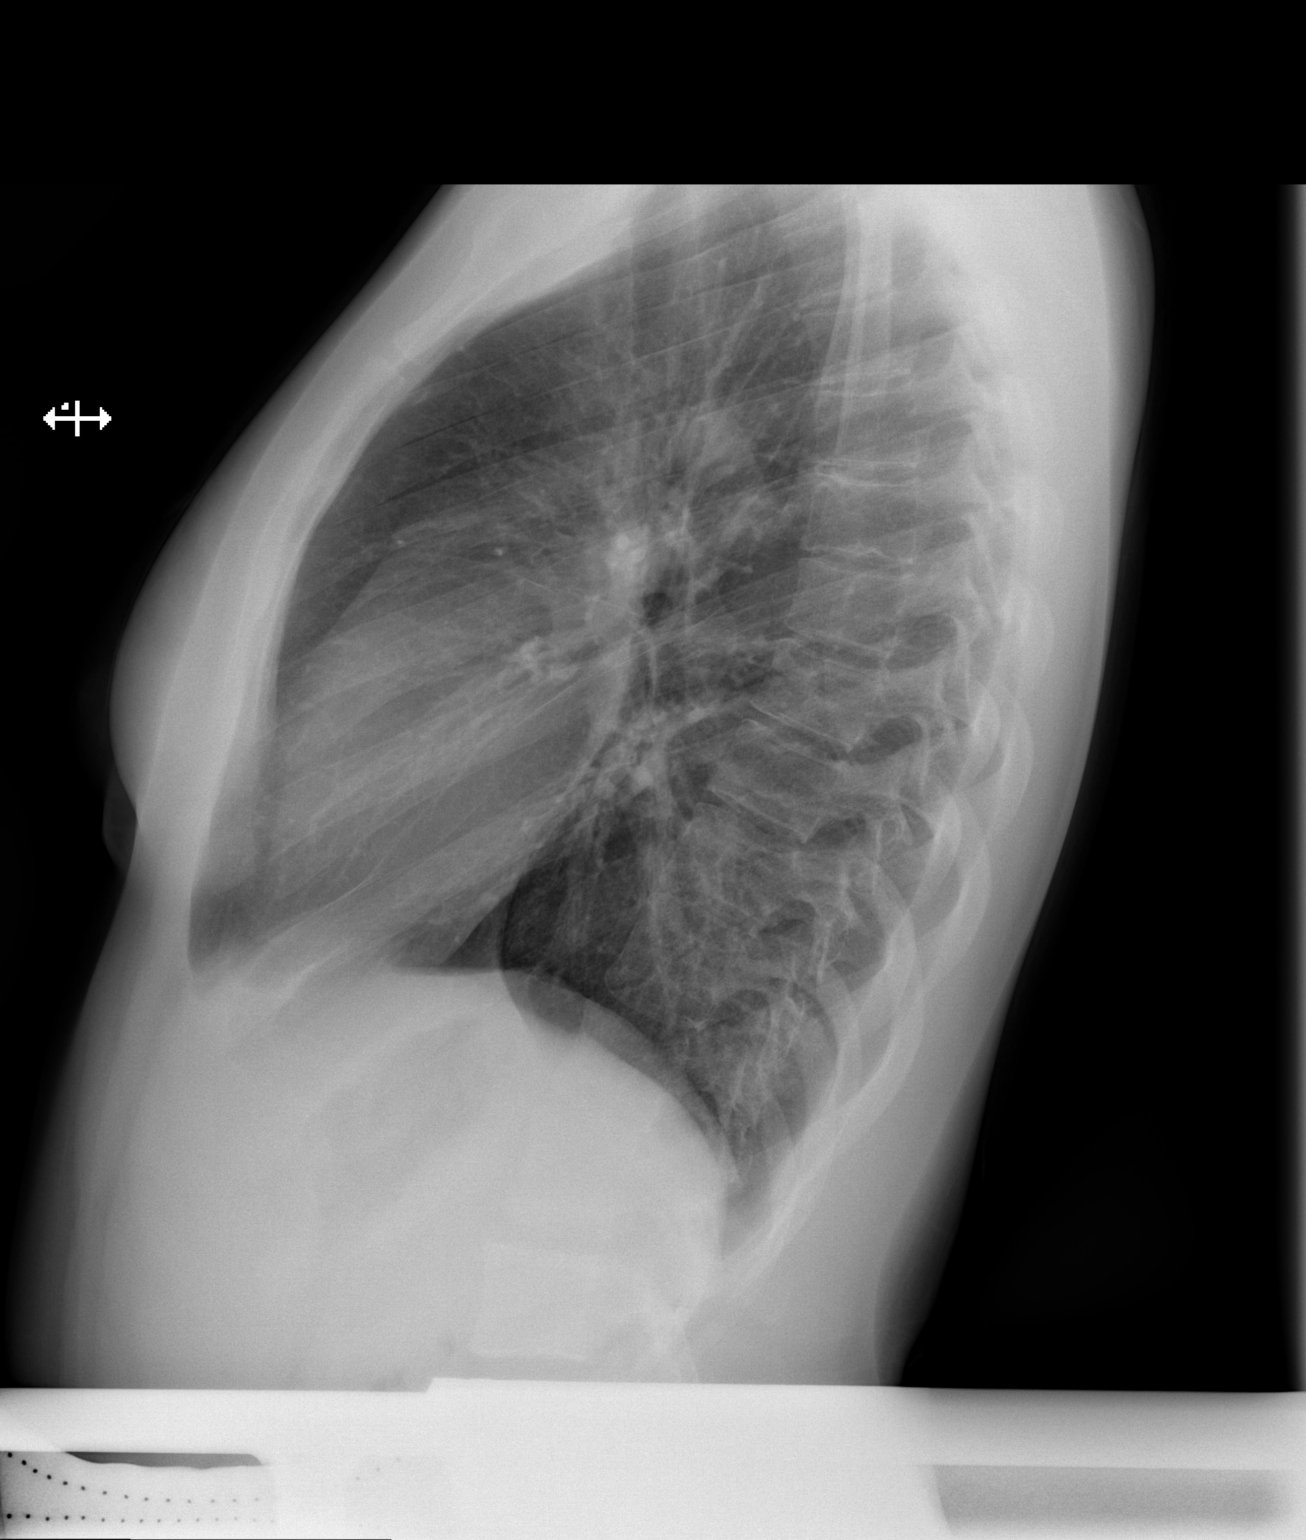

[2 of 2 positions shown; findings below may reference images not displayed]

FINDINGS: The lungs are mildly hyper inflated. Slightly increased lung
markings are noted in the retrocardiac region today. These are
difficult to triangulate on the frontal radiograph but likely lie on
the left. The heart and pulmonary vascularity are normal. The
mediastinum is normal in width. There is no pleural effusion. There
is mild S shaped thoracolumbar curvature which is stable.
IMPRESSION: Subtle increased density in the left lower lobe compatible with
subsegmental atelectasis or very early pneumonia. Underlying
reactive airway disease.

## 2023-04-17 ENCOUNTER — Ambulatory Visit
Admission: EM | Admit: 2023-04-17 | Discharge: 2023-04-17 | Disposition: A | Discharge status: Home / Self Care | Payer: Medicaid Other | Attending: Family Medicine | Admitting: Family Medicine

## 2023-04-17 DIAGNOSIS — Z113 Encounter for screening for infections with a predominantly sexual mode of transmission: Secondary | ICD-10-CM | POA: Diagnosis present

## 2023-04-17 DIAGNOSIS — N898 Other specified noninflammatory disorders of vagina: Secondary | ICD-10-CM | POA: Diagnosis present

## 2023-04-17 LAB — POCT URINE PREGNANCY: Preg Test, Ur: NEGATIVE

## 2023-04-17 MED ORDER — ONDANSETRON 8 MG PO TBDP
8.0000 mg | ORAL_TABLET | Freq: Once | ORAL | Status: AC
  Administered 2023-04-17: 8 mg via ORAL

## 2023-04-17 MED ORDER — DOXYCYCLINE HYCLATE 100 MG PO CAPS: 100.0000 mg | ORAL_CAPSULE | Freq: Two times a day (BID) | ORAL | 0 refills | Status: DC

## 2023-04-17 MED ORDER — GENTAMICIN SULFATE 40 MG/ML IJ SOLN
240.0000 mg | Freq: Once | INTRAMUSCULAR | Status: AC
  Administered 2023-04-17: 240 mg via INTRAMUSCULAR

## 2023-04-17 NOTE — Discharge Instructions (Addendum)
You have been given the following today for treatment of suspected gonorrhea and/or chlamydia:  gentamicin injection 240 mg  Please pick up your prescription for doxycycline 100 mg and begin taking twice daily for the next seven (7) days.  Even though we have treated you today, we have sent testing for sexually transmitted infections. We will notify you of any positive results once they are received. If required, we will prescribe any medications you might need.  Please refrain from all sexual activity for at least the next seven days.

## 2023-04-17 NOTE — ED Triage Notes (Signed)
Pt presents for STD testing.   States she has vaginal discharge and discomfort x 1 week

## 2023-04-17 NOTE — ED Provider Notes (Signed)
Provider was notified by CMA Adriana that patient was shaking uncontrollably after receiving gentamicin.  This writer walked in the room and patient was alert however was anxious complaining of nausea and feeling lethargic.  This writer prescribe Zofran 8 mg, a wet towel was placed on patient's head fever elevated and patient was advised to remain ambulatory until symptoms resolve.  Vital signs were taken., see EMR    Bing Neighbors, NP 04/17/23 1843

## 2023-04-18 LAB — CERVICOVAGINAL ANCILLARY ONLY
Bacterial Vaginitis (gardnerella): POSITIVE — AB
Candida Glabrata: NEGATIVE
Candida Vaginitis: NEGATIVE
Chlamydia: NEGATIVE
Comment: NEGATIVE
Comment: NEGATIVE
Comment: NEGATIVE
Comment: NEGATIVE
Comment: NEGATIVE
Comment: NORMAL
Neisseria Gonorrhea: NEGATIVE
Trichomonas: NEGATIVE

## 2023-04-18 LAB — HIV ANTIBODY (ROUTINE TESTING W REFLEX): HIV Screen 4th Generation wRfx: NONREACTIVE

## 2023-04-18 LAB — RPR: RPR Ser Ql: NONREACTIVE

## 2023-04-21 ENCOUNTER — Telehealth: Payer: Self-pay

## 2023-04-21 MED ORDER — METRONIDAZOLE 500 MG PO TABS: 500.0000 mg | ORAL_TABLET | Freq: Two times a day (BID) | ORAL | 0 refills | Status: AC

## 2023-04-21 NOTE — Telephone Encounter (Signed)
Per protocol pt requires tx with metronidazole. Reviewed with patient, verified pharmacy, prescription sent.  

## 2023-05-24 ENCOUNTER — Other Ambulatory Visit (INDEPENDENT_AMBULATORY_CARE_PROVIDER_SITE_OTHER): Payer: Self-pay | Admitting: Family Medicine

## 2023-06-01 ENCOUNTER — Other Ambulatory Visit: Payer: Self-pay

## 2023-06-01 ENCOUNTER — Emergency Department (HOSPITAL_COMMUNITY)
Admission: EM | Admit: 2023-06-01 | Discharge: 2023-06-01 | Disposition: A | Discharge status: Home / Self Care | Payer: Medicaid Other | Source: Home / Self Care | Attending: Emergency Medicine | Admitting: Emergency Medicine

## 2023-06-01 ENCOUNTER — Encounter (HOSPITAL_COMMUNITY): Payer: Self-pay

## 2023-06-01 DIAGNOSIS — R17 Unspecified jaundice: Secondary | ICD-10-CM | POA: Insufficient documentation

## 2023-06-01 DIAGNOSIS — Z7951 Long term (current) use of inhaled steroids: Secondary | ICD-10-CM | POA: Insufficient documentation

## 2023-06-01 DIAGNOSIS — Z7952 Long term (current) use of systemic steroids: Secondary | ICD-10-CM | POA: Insufficient documentation

## 2023-06-01 DIAGNOSIS — J4541 Moderate persistent asthma with (acute) exacerbation: Secondary | ICD-10-CM | POA: Diagnosis not present

## 2023-06-01 DIAGNOSIS — R0602 Shortness of breath: Secondary | ICD-10-CM | POA: Diagnosis present

## 2023-06-01 MED ORDER — AEROCHAMBER PLUS FLO-VU MISC
1.0000 | Freq: Once | Status: DC
  Filled 2023-06-01: qty 1

## 2023-06-01 MED ORDER — ALBUTEROL SULFATE HFA 108 (90 BASE) MCG/ACT IN AERS
8.0000 | INHALATION_SPRAY | Freq: Once | RESPIRATORY_TRACT | Status: AC
  Administered 2023-06-01: 8 via RESPIRATORY_TRACT
  Filled 2023-06-01: qty 6.7

## 2023-06-01 MED ORDER — PREDNISONE 20 MG PO TABS: ORAL_TABLET | ORAL | 0 refills | Status: DC

## 2023-06-01 MED ORDER — PREDNISONE 20 MG PO TABS
60.0000 mg | ORAL_TABLET | Freq: Once | ORAL | Status: AC
  Administered 2023-06-01: 60 mg via ORAL
  Filled 2023-06-01: qty 3

## 2023-06-01 NOTE — ED Triage Notes (Signed)
Pt came to ED for [facility] 2 days ago. Hx of asthma. Pt ran out of inhaler and pcp can not see pt until September. Pt also requesting vaginal swab. C/O discharge and irritation.

## 2023-06-01 NOTE — Discharge Instructions (Signed)
Use your inhaler every 4 hours(6 puffs) while awake, return for sudden worsening shortness of breath, or if you need to use your inhaler more often.  ° °

## 2023-06-01 NOTE — ED Provider Notes (Signed)
[place] EMERGENCY DEPARTMENT AT [facility] Provider Note   CSN: [id number] Arrival date & time: 06/01/23  4098     History  Chief Complaint  Patient presents with   Shortness of Breath    Jodi Roth is a 27 y.o. female.  27 yo F with a chief complaints of difficulty breathing.  The patient has a history of asthma and has recently moved to the area and is unable to schedule an appointment.  She has had symptoms consistent with her asthma wheezing difficulty breathing over the past 24 to 48 hours.  Her children are both sick with some sort of upper respiratory illness.  She denies any fevers.  Denies significant cough out of normal.        Home Medications Prior to Admission medications   Medication Sig Start Date End Date Taking? Authorizing Provider  predniSONE (DELTASONE) 20 MG tablet 2 tabs po daily x 4 days 06/01/23  Yes Melene Plan, DO  albuterol (PROVENTIL HFA;VENTOLIN HFA) 108 (90 Base) MCG/ACT inhaler Inhale 2 puffs into the lungs every 4 (four) hours as needed for wheezing or shortness of breath. 02/07/16   Danelle Berry, PA-C  Biotin 5 MG TABS Take 1 tablet by mouth daily.    [provider]  doxycycline (VIBRAMYCIN) 100 MG capsule Take 1 capsule (100 mg total) by mouth 2 (two) times daily. 04/17/23   Mardella Layman, MD      Allergies    Penicillins and Egg-derived products    Review of Systems   Review of Systems  Physical Exam Updated Vital Signs BP 126/82 (BP Location: Right Arm)   Pulse 88   Temp 98.2 F (36.8 C) (Temporal)   Resp 16   Ht 5\' 6"  (1.676 m)   Wt 72.6 kg   LMP 05/08/2023   SpO2 99%   BMI 25.82 kg/m  Physical Exam Vitals and nursing note reviewed.  Constitutional:      General: She is not in acute distress.    Appearance: She is well-developed. She is not diaphoretic.  HENT:     Head: Normocephalic and atraumatic.  Eyes:     Pupils: Pupils are equal, round, and reactive to light.  Cardiovascular:     Rate and  Rhythm: Normal rate and regular rhythm.     Heart sounds: No murmur heard.    No friction rub. No gallop.  Pulmonary:     Effort: Pulmonary effort is normal.     Breath sounds: Wheezing present. No rales.     Comments: Diffuse wheezes in all lung fields with prolonged expiratory effort Abdominal:     General: There is no distension.     Palpations: Abdomen is soft.     Tenderness: There is no abdominal tenderness.  Musculoskeletal:        General: No tenderness.     Cervical back: Normal range of motion and neck supple.  Skin:    General: Skin is warm and dry.     Coloration: Skin is jaundiced.  Neurological:     Mental Status: She is alert and oriented to person, place, and time.  Psychiatric:        Behavior: Behavior normal.     ED Results / Procedures / Treatments   Labs (all labs ordered are listed, but only abnormal results are displayed) Labs Reviewed - No data to display  EKG None  Radiology No results found.  Procedures Procedures    Medications Ordered in ED Medications  aerochamber  plus with mask device 1 each (has no administration in time range)  albuterol (VENTOLIN HFA) 108 (90 Base) MCG/ACT inhaler 8 puff (8 puffs Inhalation Given 06/01/23 0843)  predniSONE (DELTASONE) tablet 60 mg (60 mg Oral Given 06/01/23 1610)    ED Course/ Medical Decision Making/ A&P                                 Medical Decision Making Risk Prescription drug management.   27 yo F with a chief complaints of a asthma exacerbation.  Patient has a history of asthma has run out of her inhaler and developed symptoms consistent with her asthma over the past 24 to 48 hours.  She is well-appearing and nontoxic.  Has lung sounds consistent with asthma.  Will give 8 puffs albuterol prednisone and reassess. Patient is feeling much better on repeat assessment.  Will discharge home.  Burst dose of steroids.  10:45 AM:  I have discussed the diagnosis/risks/treatment options with the  patient.  Evaluation and diagnostic testing in the emergency department does not suggest an emergent condition requiring admission or immediate intervention beyond what has been performed at this time.  They will follow up with PCP. We also discussed returning to the ED immediately if new or worsening sx occur. We discussed the sx which are most concerning (e.g., sudden worsening pain, fever, inability to tolerate by mouth, need to use inhaler more often than every 4 hours) that necessitate immediate return. Medications administered to the patient during their visit and any new prescriptions provided to the patient are listed below.  Medications given during this visit Medications  aerochamber plus with mask device 1 each (has no administration in time range)  albuterol (VENTOLIN HFA) 108 (90 Base) MCG/ACT inhaler 8 puff (8 puffs Inhalation Given 06/01/23 0843)  predniSONE (DELTASONE) tablet 60 mg (60 mg Oral Given 06/01/23 9604)     The patient appears reasonably screen and/or stabilized for discharge and I doubt any other medical condition or other [facility] requiring further screening, evaluation, or treatment in the ED at this time prior to discharge.         Final Clinical Impression(s) / ED Diagnoses Final diagnoses:  Moderate persistent asthma with exacerbation    Rx / DC Orders ED Discharge Orders          Ordered    predniSONE (DELTASONE) 20 MG tablet        06/01/23 1001              Melene Plan, DO 06/01/23 1045

## 2023-07-04 ENCOUNTER — Ambulatory Visit
Admission: EM | Admit: 2023-07-04 | Discharge: 2023-07-04 | Disposition: A | Discharge status: Home / Self Care | Payer: Medicaid Other | Attending: Physician Assistant | Admitting: Physician Assistant

## 2023-07-04 DIAGNOSIS — N898 Other specified noninflammatory disorders of vagina: Secondary | ICD-10-CM | POA: Diagnosis not present

## 2023-07-04 DIAGNOSIS — Z113 Encounter for screening for infections with a predominantly sexual mode of transmission: Secondary | ICD-10-CM | POA: Diagnosis present

## 2023-07-04 NOTE — ED Triage Notes (Signed)
"  I am just doing STI screening due to some vaginal discharge, I think it is BV but just to be sure". First noticed symptoms "about a week ago". No dysuria. Requests "Blood work too".

## 2023-07-04 NOTE — ED Notes (Signed)
Patient has near vasovagal response/episode following blood draw. She did not loose consciousness. Remains in room with ice pack on neck. Cold compress on face and over other areas of body desired. See V/S for details.

## 2023-07-05 LAB — RPR: RPR Ser Ql: NONREACTIVE

## 2023-07-05 LAB — HIV ANTIBODY (ROUTINE TESTING W REFLEX): HIV Screen 4th Generation wRfx: NONREACTIVE

## 2023-07-07 ENCOUNTER — Telehealth: Payer: Self-pay | Admitting: Emergency Medicine

## 2023-07-07 LAB — CERVICOVAGINAL ANCILLARY ONLY
Bacterial Vaginitis (gardnerella): POSITIVE — AB
Candida Glabrata: NEGATIVE
Candida Vaginitis: NEGATIVE
Chlamydia: NEGATIVE
Comment: NEGATIVE
Comment: NEGATIVE
Comment: NEGATIVE
Comment: NEGATIVE
Comment: NEGATIVE
Comment: NORMAL
Neisseria Gonorrhea: NEGATIVE
Trichomonas: NEGATIVE

## 2023-07-07 MED ORDER — METRONIDAZOLE 500 MG PO TABS: 500.0000 mg | ORAL_TABLET | Freq: Two times a day (BID) | ORAL | 0 refills | Status: DC

## 2023-07-07 NOTE — Telephone Encounter (Signed)
Metronidazole for positive BV

## 2023-07-08 NOTE — ED Provider Notes (Signed)
[facility]    CSN: [id number] Arrival date & time: 07/04/23  1127      History   Chief Complaint Chief Complaint  Patient presents with   SEXUALLY TRANSMITTED DISEASE    Testing Request    HPI Jodi Roth is a 27 y.o. female.   Patient here today for evaluation of vaginal discharge.  She would like STD screening.  She notes that she thinks her symptoms are related to BV but wants screening just to be sure.  She denies any dysuria.  She has not any fever.  She does not report any abdominal pain.  The history is provided by the patient.    Past Medical History:  Diagnosis Date   Asthma    BV (bacterial vaginosis)     Patient Active Problem List   Diagnosis Date Noted   Encounter for initial prescription of injectable contraceptive 06/12/2016   Postpartum care following vaginal delivery 06/12/2016   Pregnancy 10/16/2015    Past Surgical History:  Procedure Laterality Date   NO PAST SURGERIES      OB History     Gravida  1   Para  1   Term  1   Preterm  0   AB  0   Living  1      SAB  0   IAB  0   Ectopic  0   Multiple  0   Live Births  1            Home Medications    Prior to Admission medications   Medication Sig Start Date End Date Taking? Authorizing Provider  albuterol (PROVENTIL HFA;VENTOLIN HFA) 108 (90 Base) MCG/ACT inhaler Inhale 2 puffs into the lungs every 4 (four) hours as needed for wheezing or shortness of breath. 02/07/16   Danelle Berry, PA-C  Biotin 5 MG TABS Take 1 tablet by mouth daily.    [provider]  doxycycline (VIBRAMYCIN) 100 MG capsule Take 1 capsule (100 mg total) by mouth 2 (two) times daily. 04/17/23   Mardella Layman, MD  metroNIDAZOLE (FLAGYL) 500 MG tablet Take 1 tablet (500 mg total) by mouth 2 (two) times daily. 07/07/23   Merrilee Jansky, MD  predniSONE (DELTASONE) 20 MG tablet 2 tabs po daily x 4 days 06/01/23   Melene Plan, DO    Family History History reviewed. No  pertinent family history.  Social History Social History   Tobacco Use   Smoking status: Never   Smokeless tobacco: Never  Vaping Use   Vaping status: Never Used  Substance Use Topics   Alcohol use: No   Drug use: No     Allergies   Penicillins, Gentamicin, and Egg-derived products   Review of Systems Review of Systems  Constitutional:  Negative for chills and fever.  Eyes:  Negative for discharge and redness.  Respiratory:  Negative for shortness of breath.   Gastrointestinal:  Negative for abdominal pain, nausea and vomiting.  Genitourinary:  Positive for vaginal discharge. Negative for dysuria and genital sores.     Physical Exam Triage Vital Signs ED Triage Vitals  Encounter Vitals Group     BP 07/04/23 1139 101/63     Systolic BP Percentile --      Diastolic BP Percentile --      Pulse Rate 07/04/23 1139 78     Resp 07/04/23 1139 18     Temp 07/04/23 1139 98.8 F (37.1 C)     Temp Source 07/04/23 1139  Oral     SpO2 07/04/23 1139 97 %     Weight 07/04/23 1134 160 lb 0.9 oz (72.6 kg)     Height 07/04/23 1134 5\' 8"  (1.727 m)     Head Circumference --      Peak Flow --      Pain Score 07/04/23 1132 0     Pain Loc --      Pain Education --      Exclude from Growth Chart --    No data found.  Updated Vital Signs BP 103/70 (BP Location: Left Arm)   Pulse 80   Temp 98.8 F (37.1 C) (Oral)   Resp 18   Ht 5\' 8"  (1.727 m)   Wt 160 lb 0.9 oz (72.6 kg)   LMP  (Exact Date) Comment: "I just stopped bleeding a few days ago, just had a miscarriage".  SpO2 99%   BMI 24.34 kg/m      Physical Exam Vitals and nursing note reviewed.  Constitutional:      General: She is not in acute distress.    Appearance: Normal appearance. She is not ill-appearing.  HENT:     Head: Normocephalic and atraumatic.  Eyes:     Conjunctiva/sclera: Conjunctivae normal.  Cardiovascular:     Rate and Rhythm: Normal rate.  Pulmonary:     Effort: Pulmonary effort is normal. No  respiratory distress.  Neurological:     Mental Status: She is alert.  Psychiatric:        Mood and Affect: Mood normal.        Behavior: Behavior normal.        Thought Content: Thought content normal.      UC Treatments / Results  Labs (all labs ordered are listed, but only abnormal results are displayed) Labs Reviewed  CERVICOVAGINAL ANCILLARY ONLY - Abnormal; Notable for the following components:      Result Value   Bacterial Vaginitis (gardnerella) Positive (*)    All other components within normal limits  RPR   Narrative:    Performed at:  [address], [place], [place]  [id number] Lab Director: Jolene Schimke MD, Phone:  [phone]  HIV ANTIBODY (ROUTINE TESTING W REFLEX)   Narrative:    Performed at:  [address] [place] [address], [place], [place]  [id number] Lab Director: Jolene Schimke MD, Phone:  [phone]  HEPATITIS PANEL, ACUTE    EKG   Radiology No results found.  Procedures Procedures (including critical care time)  Medications Ordered in UC Medications - No data to display  Initial Impression / Assessment and Plan / UC Course  I have reviewed the triage vital signs and the nursing notes.  Pertinent labs & imaging results that were available during my care of the patient were reviewed by me and considered in my medical decision making (see chart for details).    STD screening ordered.  Will await results further recommendation.  Advised she abstain from sexual intercourse while awaiting results.  Final Clinical Impressions(s) / UC Diagnoses   Final diagnoses:  Screening for STD (sexually transmitted disease)   Discharge Instructions   None    ED Prescriptions   None    PDMP not reviewed this encounter.   Tomi Bamberger, PA-C 07/08/23 1254

## 2023-08-10 ENCOUNTER — Ambulatory Visit
Admission: EM | Admit: 2023-08-10 | Discharge: 2023-08-10 | Disposition: A | Discharge status: Home / Self Care | Payer: Medicaid Other | Attending: Internal Medicine | Admitting: Internal Medicine

## 2023-08-10 DIAGNOSIS — T23241A Burn of second degree of multiple right fingers (nail), including thumb, initial encounter: Secondary | ICD-10-CM

## 2023-08-10 DIAGNOSIS — Z23 Encounter for immunization: Secondary | ICD-10-CM | POA: Diagnosis not present

## 2023-08-10 DIAGNOSIS — J4521 Mild intermittent asthma with (acute) exacerbation: Secondary | ICD-10-CM | POA: Diagnosis not present

## 2023-08-10 MED ORDER — TETANUS-DIPHTH-ACELL PERTUSSIS 5-2.5-18.5 LF-MCG/0.5 IM SUSY
0.5000 mL | PREFILLED_SYRINGE | Freq: Once | INTRAMUSCULAR | Status: AC
  Administered 2023-08-10: 0.5 mL via INTRAMUSCULAR

## 2023-08-10 MED ORDER — ALBUTEROL SULFATE HFA 108 (90 BASE) MCG/ACT IN AERS: 1.0000 | INHALATION_SPRAY | Freq: Four times a day (QID) | RESPIRATORY_TRACT | 0 refills | Status: DC | PRN

## 2023-08-10 MED ORDER — CLINDAMYCIN HCL 150 MG PO CAPS: 450.0000 mg | ORAL_CAPSULE | Freq: Three times a day (TID) | ORAL | 0 refills | Status: AC

## 2023-08-10 MED ORDER — DEXAMETHASONE SODIUM PHOSPHATE 10 MG/ML IJ SOLN
10.0000 mg | Freq: Once | INTRAMUSCULAR | Status: AC
  Administered 2023-08-10: 10 mg via INTRAMUSCULAR

## 2023-08-10 MED ORDER — AEROCHAMBER PLUS FLO-VU SMALL MISC
1.0000 | Freq: Once | Status: AC
  Administered 2023-08-10: 1

## 2023-08-10 MED ORDER — ALBUTEROL SULFATE (2.5 MG/3ML) 0.083% IN NEBU
2.5000 mg | INHALATION_SOLUTION | Freq: Once | RESPIRATORY_TRACT | Status: AC
  Administered 2023-08-10: 2.5 mg via RESPIRATORY_TRACT

## 2023-08-10 MED ORDER — SILVER SULFADIAZINE 1 % EX CREA: TOPICAL_CREAM | Freq: Once | CUTANEOUS | Status: AC
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
## 2023-08-10 NOTE — ED Provider Notes (Addendum)
[facility]    CSN: [id number] Arrival date & time: 08/10/23  1130      History   Chief Complaint No chief complaint on file.   HPI Jodi Roth is a 27 y.o. female.   Patient presents for 2 different chief complaints today.  Reports that she has a burn on her right hand that occurred on 08/06/2023.  Reports that she accidentally poured some burning grease on her hand.  She was seen at a different urgent care at that time where she was prescribed Silvadene cream and advised to return in a few days for reevaluation.  States the pain went away but returned and she is concerned given that she has noticed some yellow drainage from one of the blisters.  She has full range of motion of her hand.  Denies numbness or tingling.  She thinks that she may have gotten a tetanus vaccine 6 months ago. She was not given tetanus vaccine at previous urgent care visit.   Patient also reporting concern of asthma exacerbation that started this morning.  Reports that she did not have albuterol medication to help.  She does state that she feels better but does feel herself wheezing still.  Reports she 1.  Asthma is not sure exactly what triggered her asthma flareup.     Past Medical History:  Diagnosis Date   Asthma    BV (bacterial vaginosis)     Patient Active Problem List   Diagnosis Date Noted   Encounter for initial prescription of injectable contraceptive 06/12/2016   Postpartum care following vaginal delivery 06/12/2016   Pregnancy 10/16/2015    Past Surgical History:  Procedure Laterality Date   NO PAST SURGERIES      OB History     Gravida  1   Para  1   Term  1   Preterm  0   AB  0   Living  1      SAB  0   IAB  0   Ectopic  0   Multiple  0   Live Births  1            Home Medications    Prior to Admission medications   Medication Sig Start Date End Date Taking? Authorizing Provider  albuterol (VENTOLIN HFA) 108 (90 Base) MCG/ACT  inhaler Inhale 1-2 puffs into the lungs every 6 (six) hours as needed for wheezing or shortness of breath. 08/10/23  Yes Chamia Schmutz, Acie Fredrickson, FNP  clindamycin (CLEOCIN) 150 MG capsule Take 3 capsules (450 mg total) by mouth 3 (three) times daily for 5 days. 08/10/23 08/15/23 Yes Keawe Marcello, Acie Fredrickson, FNP  albuterol (PROVENTIL HFA;VENTOLIN HFA) 108 (90 Base) MCG/ACT inhaler Inhale 2 puffs into the lungs every 4 (four) hours as needed for wheezing or shortness of breath. 02/07/16   Danelle Berry, PA-C  Biotin 5 MG TABS Take 1 tablet by mouth daily.    [provider]  doxycycline (VIBRAMYCIN) 100 MG capsule Take 1 capsule (100 mg total) by mouth 2 (two) times daily. 04/17/23   Mardella Layman, MD  metroNIDAZOLE (FLAGYL) 500 MG tablet Take 1 tablet (500 mg total) by mouth 2 (two) times daily. 07/07/23   Merrilee Jansky, MD  predniSONE (DELTASONE) 20 MG tablet 2 tabs po daily x 4 days 06/01/23   Melene Plan, DO    Family History No family history on file.  Social History Social History   Tobacco Use   Smoking status: Never   Smokeless  tobacco: Never  Vaping Use   Vaping status: Never Used  Substance Use Topics   Alcohol use: No   Drug use: No     Allergies   Penicillins, Gentamicin, and Egg-derived products   Review of Systems Review of Systems Per HPI  Physical Exam Triage Vital Signs ED Triage Vitals  Encounter Vitals Group     BP 08/10/23 1245 108/66     Systolic BP Percentile --      Diastolic BP Percentile --      Pulse Rate 08/10/23 1245 100     Resp --      Temp 08/10/23 1245 98.6 F (37 C)     Temp Source 08/10/23 1245 Oral     SpO2 08/10/23 1245 100 %     Weight 08/10/23 1244 156 lb (70.8 kg)     Height 08/10/23 1244 5\' 7"  (1.702 m)     Head Circumference --      Peak Flow --      Pain Score 08/10/23 1244 8     Pain Loc --      Pain Education --      Exclude from Growth Chart --    No data found.  Updated Vital Signs BP 108/66 (BP Location: Left Arm)   Pulse 100    Temp 98.6 F (37 C) (Oral)   Ht 5\' 7"  (1.702 m)   Wt 156 lb (70.8 kg)   LMP 07/17/2023   SpO2 100%   Breastfeeding No   BMI 24.43 kg/m   Visual Acuity Right Eye Distance:   Left Eye Distance:   Bilateral Distance:    Right Eye Near:   Left Eye Near:    Bilateral Near:     Physical Exam Constitutional:      General: She is not in acute distress.    Appearance: Normal appearance. She is not toxic-appearing.  HENT:     Head: Normocephalic and atraumatic.  Eyes:     Extraocular Movements: Extraocular movements intact.     Conjunctiva/sclera: Conjunctivae normal.  Cardiovascular:     Rate and Rhythm: Normal rate and regular rhythm.     Pulses: Normal pulses.     Heart sounds: Normal heart sounds.  Pulmonary:     Effort: Pulmonary effort is normal. No respiratory distress.     Breath sounds: Wheezing present.     Comments: Wheezing noted to auscultation Skin:    Comments: Patient has burn throughout the dorsal surface of the right hand involving the 1st through 4th digits.  There are multiple blisters noted throughout fingers.  One of the blisters has now drained which is present to the proximal portion of the first digit.  Very mild yellow discoloration noted to the wound bed with mild drainage.  Full range of motion of fingers present.  Capillary refill and pulses are intact.  Neurological:     General: No focal deficit present.     Mental Status: She is alert and oriented to person, place, and time. Mental status is at baseline.  Psychiatric:        Mood and Affect: Mood normal.        Behavior: Behavior normal.        Thought Content: Thought content normal.        Judgment: Judgment normal.      UC Treatments / Results  Labs (all labs ordered are listed, but only abnormal results are displayed) Labs Reviewed - No data to display  EKG  Radiology No results found.  Procedures Procedures (including critical care time)  Medications Ordered in  UC Medications  Tdap (BOOSTRIX) injection 0.5 mL (0.5 mLs Intramuscular Given 08/10/23 1323)  albuterol (PROVENTIL) (2.5 MG/3ML) 0.083% nebulizer solution 2.5 mg (2.5 mg Nebulization Given 08/10/23 1323)  dexamethasone (DECADRON) injection 10 mg (10 mg Intramuscular Given 08/10/23 1325)  silver sulfADIAZINE (SILVADENE) 1 % cream ( Topical Given 08/10/23 1405)  AeroChamber Plus Flo-Vu Small device MISC 1 each (1 each Other Given 08/10/23 1405)    Initial Impression / Assessment and Plan / UC Course  I have reviewed the triage vital signs and the nursing notes.  Pertinent labs & imaging results that were available during my care of the patient were reviewed by me and considered in my medical decision making (see chart for details).     1.  Asthma exacerbation  Patient appears to have a very mild asthma flareup today on exam.  She is not tachypneic and oxygen is normal so do not think that emergent evaluation is necessary.  Patient was given IM Decadron and albuterol nebulizer treatment in urgent care with patient stating that she felt much better.  Will refill patient's albuterol inhaler to take at home and encouraged her to follow-up if any symptoms persist or worsen.  2.  Burn of right hand  I am mildly concerned for infection to the burn so will treat with clindamycin given patient's allergy list.  Wound was dressed by clinical staff with Silvadene cream.  She does have Silvadene cream at home so encouraged her to change dressings daily and as needed.  Educated patient on wound care.  I did place ambulatory referral to wound and burn care center as well for follow-up.  Advised patient that if they do not call her in the next few days to schedule the appointment, she is to call them herself and to follow-up here in 3 to 4 days for reevaluation.  Patient states that she thinks that tetanus vaccine was updated 6 months ago but I did update tetanus vaccination today to ensure adequate protection.  She  did not receive tetanus vaccine at previous urgent care visit per her report and per further review of the note.  Patient verbalized understanding and was agreeable with plan. Patient denies that she is breastfeeding or has any concerns for pregnancy so these meds should be safe.  Final Clinical Impressions(s) / UC Diagnoses   Final diagnoses:  Partial thickness burn of multiple digits of right hand including partial thickness burn of thumb, initial encounter  Mild intermittent asthma with acute exacerbation     Discharge Instructions      Please change dressing daily and as needed if it becomes soiled.  I have prescribed an antibiotic to treat any infection associated with a burn.  Take with food to avoid stomach upset.  Please continue antibiotic cream as well.  I have placed a referral to wound care center.  If they do not call you, please call yourself and schedule an appointment.  Follow-up with [place] in 3 to 4 days for recheck if not able to be seen by them first.  I have refilled your albuterol inhaler.  Follow-up if these symptoms persist or worsen.     ED Prescriptions     Medication Sig Dispense Auth. Provider   clindamycin (CLEOCIN) 150 MG capsule Take 3 capsules (450 mg total) by mouth 3 (three) times daily for 5 days. 45 capsule Ervin Knack E, [place]   albuterol (VENTOLIN  HFA) 108 (90 Base) MCG/ACT inhaler Inhale 1-2 puffs into the lungs every 6 (six) hours as needed for wheezing or shortness of breath. 1 each Gustavus Bryant, [place]      PDMP not reviewed this encounter.   Gustavus Bryant, [place] 08/10/23 1431    Gustavus Bryant, [place] 08/10/23 1432

## 2023-08-10 NOTE — ED Triage Notes (Addendum)
Patient presents with burn on right hand, states she was cooking on Wednesday, went to pour cooking oil out and burned hand. Blisters and drainage on hand. No treated used.   Patient states she also had an asthma attack this morning, do not have an inhaler, is unable to get in with PCP until February 4. 2025.

## 2023-09-28 ENCOUNTER — Ambulatory Visit: Payer: Medicaid Other

## 2023-11-14 ENCOUNTER — Ambulatory Visit
Admission: EM | Admit: 2023-11-14 | Discharge: 2023-11-14 | Disposition: A | Discharge status: Home / Self Care | Payer: Medicaid Other | Attending: Emergency Medicine | Admitting: Emergency Medicine

## 2023-11-14 ENCOUNTER — Encounter: Payer: Self-pay | Admitting: *Deleted

## 2023-11-14 ENCOUNTER — Other Ambulatory Visit: Payer: Self-pay

## 2023-11-14 DIAGNOSIS — N760 Acute vaginitis: Secondary | ICD-10-CM | POA: Insufficient documentation

## 2023-11-14 DIAGNOSIS — B9689 Other specified bacterial agents as the cause of diseases classified elsewhere: Secondary | ICD-10-CM | POA: Insufficient documentation

## 2023-11-14 DIAGNOSIS — N898 Other specified noninflammatory disorders of vagina: Secondary | ICD-10-CM | POA: Diagnosis present

## 2023-11-14 MED ORDER — METRONIDAZOLE 500 MG PO TABS: 500.0000 mg | ORAL_TABLET | Freq: Two times a day (BID) | ORAL | 0 refills | Status: DC

## 2023-11-14 NOTE — ED Provider Notes (Signed)
 [facility]    CSN: [id number] Arrival date & time: 11/14/23  0850      History   Chief Complaint Chief Complaint  Patient presents with   Vaginal Discharge    HPI Jodi Roth is a 28 y.o. female.   Patient presents presents today with vaginal discharge white in color no odor states is very similar and that she had before with BV.  Patient denies any abdominal pain no burning on urination no concern for pregnancy.  Patient is asking for full testing and blood work.    Past Medical History:  Diagnosis Date   Asthma    BV (bacterial vaginosis)     Patient Active Problem List   Diagnosis Date Noted   Encounter for initial prescription of injectable contraceptive 06/12/2016   Postpartum care following vaginal delivery 06/12/2016   Pregnancy 10/16/2015    Past Surgical History:  Procedure Laterality Date   NO PAST SURGERIES      OB History     Gravida  1   Para  1   Term  1   Preterm  0   AB  0   Living  1      SAB  0   IAB  0   Ectopic  0   Multiple  0   Live Births  1            Home Medications    Prior to Admission medications   Medication Sig Start Date End Date Taking? Authorizing Provider  albuterol  (VENTOLIN  HFA) 108 (90 Base) MCG/ACT inhaler Inhale 1-2 puffs into the lungs every 6 (six) hours as needed for wheezing or shortness of breath. 08/10/23  Yes [place], Darryle E, FNP  metroNIDAZOLE  (FLAGYL ) 500 MG tablet Take 1 tablet (500 mg total) by mouth 2 (two) times daily. 11/14/23  Yes Merilee Andrea CROME, NP  albuterol  (PROVENTIL  HFA;VENTOLIN  HFA) 108 (90 Base) MCG/ACT inhaler Inhale 2 puffs into the lungs every 4 (four) hours as needed for wheezing or shortness of breath. 02/07/16   Tapia, Leisa, PA-C  Biotin 5 MG TABS Take 1 tablet by mouth daily.    [provider]  predniSONE  (DELTASONE ) 20 MG tablet 2 tabs po daily x 4 days 06/01/23   Emil Share, DO  silver  sulfADIAZINE  (SILVADENE ) 1 % cream Apply 1 Application  topically daily. 08/06/23 02/02/24  [provider]    Family History History reviewed. No pertinent family history.  Social History Social History   Tobacco Use   Smoking status: Never   Smokeless tobacco: Never  Vaping Use   Vaping status: Never Used  Substance Use Topics   Alcohol use: No   Drug use: No     Allergies   Penicillins, Gentamicin , and Egg-derived products   Review of Systems Review of Systems  Constitutional: Negative.   HENT: Negative.    Respiratory: Negative.    Cardiovascular: Negative.   Gastrointestinal: Negative.  Negative for abdominal pain.  Genitourinary:  Positive for vaginal discharge. Negative for pelvic pain, urgency, vaginal bleeding and vaginal pain.  Neurological: Negative.      Physical Exam Triage Vital Signs ED Triage Vitals  Encounter Vitals Group     BP 11/14/23 0954 113/71     Systolic BP Percentile --      Diastolic BP Percentile --      Pulse Rate 11/14/23 0954 62     Resp 11/14/23 0954 18     Temp 11/14/23 0954 98.1 F (36.7 C)  Temp Source 11/14/23 0954 Oral     SpO2 11/14/23 0954 97 %     Weight --      Height --      Head Circumference --      Peak Flow --      Pain Score 11/14/23 0952 0     Pain Loc --      Pain Education --      Exclude from Growth Chart --    No data found.  Updated Vital Signs BP 113/71 (BP Location: Left Arm)   Pulse 62   Temp 98.1 F (36.7 C) (Oral)   Resp 18   LMP 11/02/2023   SpO2 97%   Visual Acuity Right Eye Distance:   Left Eye Distance:   Bilateral Distance:    Right Eye Near:   Left Eye Near:    Bilateral Near:     Physical Exam Constitutional:      Appearance: Normal appearance.  Cardiovascular:     Rate and Rhythm: Normal rate.  Pulmonary:     Effort: Pulmonary effort is normal.  Abdominal:     General: Abdomen is flat. Bowel sounds are normal.  Skin:    General: Skin is warm.  Neurological:     General: No focal deficit present.     Mental  Status: She is alert.      UC Treatments / Results  Labs (all labs ordered are listed, but only abnormal results are displayed) Labs Reviewed  HIV ANTIBODY (ROUTINE TESTING W REFLEX)  CERVICOVAGINAL ANCILLARY ONLY    EKG   Radiology No results found.  Procedures Procedures (including critical care time)  Medications Ordered in UC Medications - No data to display  Initial Impression / Assessment and Plan / UC Course  I have reviewed the triage vital signs and the nursing notes.  Pertinent labs & imaging results that were available during my care of the patient were reviewed by me and considered in my medical decision making (see chart for details).     Discussed patient we will treat for BV and send off testing. Check your MyChart for results in 3 days We will contact you with any positive results and treatment plan Stay hydrated on medication and do not drink alcohol while taking  Final Clinical Impressions(s) / UC Diagnoses   Final diagnoses:  Vaginal discharge  Bacterial vaginosis     Discharge Instructions      Discussed patient we will treat for BV and send off testing. Check your MyChart for results in 3 days We will contact you with any positive results and treatment plan Stay hydrated on medication and do not drink alcohol while taking     ED Prescriptions     Medication Sig Dispense Auth. Provider   metroNIDAZOLE  (FLAGYL ) 500 MG tablet Take 1 tablet (500 mg total) by mouth 2 (two) times daily. 14 tablet Merilee Andrea CROME, NP      PDMP not reviewed this encounter.   Merilee Andrea CROME, NP 11/14/23 [phone]

## 2023-11-14 NOTE — Discharge Instructions (Signed)
 Discussed patient we will treat for BV and send off testing. Check your MyChart for results in 3 days We will contact you with any positive results and treatment plan Stay hydrated on medication and do not drink alcohol while taking

## 2023-11-14 NOTE — ED Triage Notes (Signed)
 Vaginal discharge x 1 week. Denies urinary symptoms. Would like testing for STI and bloodwork

## 2023-11-15 LAB — HIV ANTIBODY (ROUTINE TESTING W REFLEX): HIV Screen 4th Generation wRfx: NONREACTIVE

## 2023-11-17 LAB — CERVICOVAGINAL ANCILLARY ONLY
Bacterial Vaginitis (gardnerella): POSITIVE — AB
Candida Glabrata: POSITIVE — AB
Candida Vaginitis: POSITIVE — AB
Chlamydia: NEGATIVE
Comment: NEGATIVE
Comment: NEGATIVE
Comment: NEGATIVE
Comment: NEGATIVE
Comment: NEGATIVE
Comment: NORMAL
Neisseria Gonorrhea: NEGATIVE
Trichomonas: NEGATIVE

## 2023-11-18 ENCOUNTER — Telehealth (HOSPITAL_BASED_OUTPATIENT_CLINIC_OR_DEPARTMENT_OTHER): Payer: Self-pay

## 2023-11-18 MED ORDER — FLUCONAZOLE 150 MG PO TABS: 150.0000 mg | ORAL_TABLET | Freq: Once | ORAL | 0 refills | Status: AC

## 2023-11-18 NOTE — Telephone Encounter (Signed)
Per protocol, pt requires tx with Diflucan.  Rx sent to pharmacy on file.

## 2023-12-13 ENCOUNTER — Other Ambulatory Visit: Payer: Self-pay

## 2023-12-13 ENCOUNTER — Emergency Department (HOSPITAL_BASED_OUTPATIENT_CLINIC_OR_DEPARTMENT_OTHER)

## 2023-12-13 ENCOUNTER — Emergency Department (HOSPITAL_BASED_OUTPATIENT_CLINIC_OR_DEPARTMENT_OTHER)
Admission: EM | Admit: 2023-12-13 | Discharge: 2023-12-13 | Disposition: A | Discharge status: Home / Self Care | Attending: Emergency Medicine | Admitting: Emergency Medicine

## 2023-12-13 ENCOUNTER — Encounter (HOSPITAL_BASED_OUTPATIENT_CLINIC_OR_DEPARTMENT_OTHER): Payer: Self-pay | Admitting: *Deleted

## 2023-12-13 DIAGNOSIS — R0602 Shortness of breath: Secondary | ICD-10-CM | POA: Diagnosis present

## 2023-12-13 DIAGNOSIS — J4541 Moderate persistent asthma with (acute) exacerbation: Secondary | ICD-10-CM | POA: Insufficient documentation

## 2023-12-13 DIAGNOSIS — Z7951 Long term (current) use of inhaled steroids: Secondary | ICD-10-CM | POA: Insufficient documentation

## 2023-12-13 DIAGNOSIS — Z7952 Long term (current) use of systemic steroids: Secondary | ICD-10-CM | POA: Insufficient documentation

## 2023-12-13 LAB — CBC WITH DIFFERENTIAL/PLATELET
Abs Immature Granulocytes: 0.03 10*3/uL (ref 0.00–0.07)
Basophils Absolute: 0 10*3/uL (ref 0.0–0.1)
Basophils Relative: 0 %
Eosinophils Absolute: 0 10*3/uL (ref 0.0–0.5)
Eosinophils Relative: 0 %
HCT: 36.8 % (ref 36.0–46.0)
Hemoglobin: 12.1 g/dL (ref 12.0–15.0)
Immature Granulocytes: 0 %
Lymphocytes Relative: 6 %
Lymphs Abs: 0.6 10*3/uL — ABNORMAL LOW (ref 0.7–4.0)
MCH: 29.7 pg (ref 26.0–34.0)
MCHC: 32.9 g/dL (ref 30.0–36.0)
MCV: 90.2 fL (ref 80.0–100.0)
Monocytes Absolute: 0.7 10*3/uL (ref 0.1–1.0)
Monocytes Relative: 7 %
Neutro Abs: 8.7 10*3/uL — ABNORMAL HIGH (ref 1.7–7.7)
Neutrophils Relative %: 87 %
Platelets: 219 10*3/uL (ref 150–400)
RBC: 4.08 MIL/uL (ref 3.87–5.11)
RDW: 13.4 % (ref 11.5–15.5)
WBC: 10 10*3/uL (ref 4.0–10.5)
nRBC: 0 % (ref 0.0–0.2)

## 2023-12-13 LAB — RESP PANEL BY RT-PCR (RSV, FLU A&B, COVID)  RVPGX2
Influenza A by PCR: NEGATIVE
Influenza B by PCR: NEGATIVE
Resp Syncytial Virus by PCR: NEGATIVE
SARS Coronavirus 2 by RT PCR: NEGATIVE

## 2023-12-13 LAB — BASIC METABOLIC PANEL
Anion gap: 8 (ref 5–15)
BUN: 8 mg/dL (ref 6–20)
CO2: 23 mmol/L (ref 22–32)
Calcium: 9.2 mg/dL (ref 8.9–10.3)
Chloride: 108 mmol/L (ref 98–111)
Creatinine, Ser: 0.69 mg/dL (ref 0.44–1.00)
GFR, Estimated: >60 mL/min (ref >60)
Glucose, Bld: 129 mg/dL — ABNORMAL HIGH (ref 70–99)
Potassium: 3.4 mmol/L — ABNORMAL LOW (ref 3.5–5.1)
Sodium: 139 mmol/L (ref 135–145)

## 2023-12-13 MED ORDER — VENTOLIN HFA 108 (90 BASE) MCG/ACT IN AERS: 2.0000 | INHALATION_SPRAY | RESPIRATORY_TRACT | 0 refills | Status: DC | PRN

## 2023-12-13 MED ORDER — ALBUTEROL SULFATE (2.5 MG/3ML) 0.083% IN NEBU
INHALATION_SOLUTION | RESPIRATORY_TRACT | Status: AC
  Filled 2023-12-13: qty 12

## 2023-12-13 MED ORDER — FLUTICASONE PROPIONATE HFA 110 MCG/ACT IN AERO: 1.0000 | INHALATION_SPRAY | Freq: Two times a day (BID) | RESPIRATORY_TRACT | 1 refills | Status: DC

## 2023-12-13 MED ORDER — PREDNISONE 10 MG PO TABS: 40.0000 mg | ORAL_TABLET | Freq: Every day | ORAL | 0 refills | Status: DC

## 2023-12-13 MED ORDER — ALBUTEROL SULFATE (2.5 MG/3ML) 0.083% IN NEBU
2.5000 mg | INHALATION_SOLUTION | Freq: Once | RESPIRATORY_TRACT | Status: AC
  Administered 2023-12-13: 2.5 mg via RESPIRATORY_TRACT

## 2023-12-13 MED ORDER — ALBUTEROL SULFATE (2.5 MG/3ML) 0.083% IN NEBU
INHALATION_SOLUTION | RESPIRATORY_TRACT | Status: AC
  Administered 2023-12-13: 5 mg via RESPIRATORY_TRACT
  Filled 2023-12-13: qty 6

## 2023-12-13 MED ORDER — PREDNISONE 20 MG PO TABS
40.0000 mg | ORAL_TABLET | Freq: Once | ORAL | Status: AC
  Administered 2023-12-13: 40 mg via ORAL
  Filled 2023-12-13: qty 2

## 2023-12-13 MED ORDER — ALBUTEROL SULFATE (2.5 MG/3ML) 0.083% IN NEBU: 5.0000 mg | INHALATION_SOLUTION | Freq: Once | RESPIRATORY_TRACT | Status: AC

## 2023-12-13 MED ORDER — ALBUTEROL SULFATE HFA 108 (90 BASE) MCG/ACT IN AERS: 2.0000 | INHALATION_SPRAY | RESPIRATORY_TRACT | 0 refills | Status: DC | PRN

## 2023-12-13 MED ORDER — ALBUTEROL SULFATE (2.5 MG/3ML) 0.083% IN NEBU: 2.5000 mg | INHALATION_SOLUTION | Freq: Four times a day (QID) | RESPIRATORY_TRACT | 1 refills | Status: DC | PRN

## 2023-12-13 MED ORDER — MAGNESIUM SULFATE 2 GM/50ML IV SOLN
2.0000 g | Freq: Once | INTRAVENOUS | Status: AC
  Administered 2023-12-13: 2 g via INTRAVENOUS
  Filled 2023-12-13: qty 50

## 2023-12-13 MED ORDER — ALBUTEROL (5 MG/ML) CONTINUOUS INHALATION SOLN: 10.0000 mg/h | INHALATION_SOLUTION | Freq: Once | RESPIRATORY_TRACT | Status: DC

## 2023-12-13 MED ORDER — IPRATROPIUM-ALBUTEROL 0.5-2.5 (3) MG/3ML IN SOLN
3.0000 mL | Freq: Once | RESPIRATORY_TRACT | Status: AC
  Administered 2023-12-13: 3 mL via RESPIRATORY_TRACT

## 2023-12-13 MED ORDER — ALBUTEROL SULFATE (2.5 MG/3ML) 0.083% IN NEBU
INHALATION_SOLUTION | RESPIRATORY_TRACT | Status: AC
  Filled 2023-12-13: qty 3

## 2023-12-13 MED ORDER — IPRATROPIUM-ALBUTEROL 0.5-2.5 (3) MG/3ML IN SOLN
RESPIRATORY_TRACT | Status: AC
  Filled 2023-12-13: qty 3

## 2023-12-13 MED ORDER — ALBUTEROL SULFATE (2.5 MG/3ML) 0.083% IN NEBU
10.0000 mg | INHALATION_SOLUTION | Freq: Once | RESPIRATORY_TRACT | Status: AC
  Administered 2023-12-13: 10 mg via RESPIRATORY_TRACT

## 2023-12-13 NOTE — ED Triage Notes (Signed)
 Pt c/o problems with her asthma and sinuses for about 3 days. Productive cough with yellow mucous. No n/v or fevers. She used the last of her inhaler about 15-20 minutes ago.

## 2023-12-13 NOTE — ED Provider Notes (Signed)
 [facility] Provider Note   CSN: [id number] Arrival date & time: 12/13/23  0306     History  Chief Complaint  Patient presents with   Cough    Jodi Roth is a 28 y.o. female.  The history is provided by the patient.  Cough Jodi Roth is a 28 y.o. female who presents to the Emergency Department complaining of shortness of breath.  She presents emergency department for evaluation of shortness of breath, chest tightness and cough, similar to prior asthma exacerbations.  She does report runny nose yesterday.  Cough is productive of yellow mucus.  She has a history of asthma and has been using her albuterol inhaler without significant improvement.  She is not on any additional medications for asthma.  Last asthma flare was about 1 month ago.  She reports flareups about every 2 months.  No fevers, abdominal pain, nausea, vomiting, leg swelling or pain.     Home Medications Prior to Admission medications   Medication Sig Start Date End Date Taking? Authorizing Provider  albuterol (PROVENTIL) (2.5 MG/3ML) 0.083% nebulizer solution Take 3 mLs (2.5 mg total) by nebulization every 6 (six) hours as needed for wheezing or shortness of breath. 12/13/23  Yes Tilden Fossa, MD  fluticasone (FLOVENT HFA) 110 MCG/ACT inhaler Inhale 1 puff into the lungs 2 (two) times daily. 12/13/23  Yes Tilden Fossa, MD  predniSONE (DELTASONE) 10 MG tablet Take 4 tablets (40 mg total) by mouth daily. 12/13/23  Yes Tilden Fossa, MD  albuterol (VENTOLIN HFA) 108 (90 Base) MCG/ACT inhaler Inhale 2 puffs into the lungs every 4 (four) hours as needed for wheezing or shortness of breath. 12/13/23   Tilden Fossa, MD  Biotin 5 MG TABS Take 1 tablet by mouth daily.    [provider]  metroNIDAZOLE (FLAGYL) 500 MG tablet Take 1 tablet (500 mg total) by mouth 2 (two) times daily. 11/14/23   Coralyn Mark, NP  silver sulfADIAZINE (SILVADENE) 1 % cream Apply  1 Application topically daily. 08/06/23 02/02/24  [provider]      Allergies    Penicillins, Gentamicin, and Egg-derived products    Review of Systems   Review of Systems  Respiratory:  Positive for cough.   All other systems reviewed and are negative.   Physical Exam Updated Vital Signs BP (!) 111/55   Pulse (!) 112   Temp 98.6 F (37 C)   Resp (!) 26   Ht 5\' 7"  (1.702 m)   Wt 63.5 kg   LMP 12/03/2023   SpO2 94%   BMI 21.93 kg/m  Physical Exam Vitals and nursing note reviewed.  Constitutional:      Appearance: She is well-developed.  HENT:     Head: Normocephalic and atraumatic.  Cardiovascular:     Rate and Rhythm: Regular rhythm. Tachycardia present.     Heart sounds: No murmur heard. Pulmonary:     Effort: Pulmonary effort is normal. No respiratory distress.     Comments: Expiratory wheezes bilaterally Abdominal:     Palpations: Abdomen is soft.     Tenderness: There is no abdominal tenderness. There is no guarding or rebound.  Musculoskeletal:        General: No tenderness.  Skin:    General: Skin is warm and dry.  Neurological:     Mental Status: She is alert and oriented to person, place, and time.  Psychiatric:        Behavior: Behavior normal.  ED Results / Procedures / Treatments   Labs (all labs ordered are listed, but only abnormal results are displayed) Labs Reviewed  BASIC METABOLIC PANEL - Abnormal; Notable for the following components:      Result Value   Potassium 3.4 (*)    Glucose, Bld 129 (*)    All other components within normal limits  CBC WITH DIFFERENTIAL/PLATELET - Abnormal; Notable for the following components:   Neutro Abs 8.7 (*)    Lymphs Abs 0.6 (*)    All other components within normal limits  RESP PANEL BY RT-PCR (RSV, FLU A&B, COVID)  [id number]    EKG EKG Interpretation Date/Time:  Saturday December 13 2023 04:27:17 EST Ventricular Rate:  112 PR Interval:  132 QRS Duration:  74 QT Interval:  323 QTC  Calculation: 441 R Axis:   69  Text Interpretation: Sinus tachycardia Biatrial enlargement Borderline repolarization abnormality Confirmed by Tilden Fossa [phone]) on 12/13/2023 4:46:06 AM  Radiology DG Chest Port 1 View Result Date: 12/13/2023 CLINICAL DATA:  28 year old female with shortness of breath. Productive cough. Asthma. EXAM: PORTABLE CHEST 1 VIEW COMPARISON:  Chest radiographs 07/03/2017 and earlier. FINDINGS: Portable AP upright view at 0411 hours. Lower lung volumes but remain normal. Normal cardiac size and mediastinal contours. Visualized tracheal air column is within normal limits. Allowing for portable technique the lungs are clear. No pneumothorax or pleural effusion. Moderate thoracolumbar scoliosis. No acute osseous abnormality identified. Negative visible bowel gas. IMPRESSION: 1.  No cardiopulmonary abnormality. 2. Thoracolumbar scoliosis is moderate. Electronically Signed   By: Odessa Fleming M.D.   On: 12/13/2023 04:51    Procedures Procedures    Medications Ordered in ED Medications  ipratropium-albuterol (DUONEB) 0.5-2.5 (3) MG/3ML nebulizer solution 3 mL (3 mLs Nebulization Given 12/13/23 0325)  albuterol (PROVENTIL) (2.5 MG/3ML) 0.083% nebulizer solution 2.5 mg (2.5 mg Nebulization Given 12/13/23 0325)  predniSONE (DELTASONE) tablet 40 mg (40 mg Oral Given 12/13/23 0335)  albuterol (PROVENTIL) (2.5 MG/3ML) 0.083% nebulizer solution 5 mg (5 mg Nebulization Given 12/13/23 0355)  magnesium sulfate IVPB 2 g 50 mL (0 g Intravenous Stopped 12/13/23 0521)  albuterol (PROVENTIL) (2.5 MG/3ML) 0.083% nebulizer solution 10 mg (10 mg Nebulization Given 12/13/23 0518)    ED Course/ Medical Decision Making/ A&P                                 Medical Decision Making Amount and/or Complexity of Data Reviewed Labs: ordered. Radiology: ordered.  Risk Prescription drug management.   Patient with history of asthma here for evaluation of increased difficulty breathing.  She has been using  her albuterol at home without relief.  On initial assessment patient with tachypnea, wheezing bilaterally with normal work of breathing.  She was initially treated with DuoNeb with no significant change in symptoms.  She was then treated with prednisone, magnesium and treated with continuous nebulizer.  After treatment she had significant improvement in her symptoms.  She does have occasional expiratory wheezes but tachypnea has resolved and she feels improved.  She is able to ambulate without difficulty.  Chest x-ray is negative for pneumothorax, pneumonia-images personally reviewed and interpreted, agree with radiologist interpretation.  Presentation is not consistent with PE.  Discussed with patient home care for asthma.  Will discharge on prednisone burst.  Providing albuterol inhaler for home as well as nebulizer.  Also will start on inhaled steroid.  Discussed close return precautions as well as need for  outpatient follow-up for repeat evaluation.        Final Clinical Impression(s) / ED Diagnoses Final diagnoses:  Moderate persistent asthma with exacerbation    Rx / DC Orders ED Discharge Orders          Ordered    predniSONE (DELTASONE) 10 MG tablet  Daily        12/13/23 0541    albuterol (VENTOLIN HFA) 108 (90 Base) MCG/ACT inhaler  Every 4 hours PRN,   Status:  Discontinued        12/13/23 0541    albuterol (PROVENTIL) (2.5 MG/3ML) 0.083% nebulizer solution  Every 6 hours PRN        12/13/23 0541    albuterol (VENTOLIN HFA) 108 (90 Base) MCG/ACT inhaler  Every 4 hours PRN        12/13/23 0541    fluticasone (FLOVENT HFA) 110 MCG/ACT inhaler  2 times daily        12/13/23 0640              Tilden Fossa, MD 12/13/23 [phone]

## 2024-02-03 ENCOUNTER — Ambulatory Visit: Admitting: Advanced Practice Midwife

## 2024-02-03 NOTE — Progress Notes (Deleted)
   Subjective:     Jodi Roth is a 28 y.o. female here at [facility] *** for a routine exam.  Current complaints: ***.  Personal and family health history reviewed: {yes/no:9010}.  Do you have a primary care provider? *** Do you feel safe at home? ***    Health Maintenance Due  Topic Date Due   Hepatitis C Screening  Never done   Pneumococcal Vaccine 59-52 Years old (1 of 2 - PCV) Never done   Cervical Cancer Screening (Pap smear)  Never done   COVID-19 Vaccine (1 - 2024-25 season) Never done     Risk factors for chronic health problems: Smoking: Alchohol/how much: Pt BMI: There is no height or weight on file to calculate BMI.   Gynecologic History No LMP recorded. Contraception: {method:5051} Last Pap: ***. Results were: {norm/abn:16337} Last mammogram: ***. Results were: {norm/abn:16337}  Obstetric History OB History  Gravida Para Term Preterm AB Living  1 1 1  0 0 1  SAB IAB Ectopic Multiple Live Births  0 0 0 0 1    # Outcome Date GA Lbr Len/2nd Weight Sex Type Anes PTL Lv  1 Term 05/08/16 [redacted]w[redacted]d 07:40 / 00:12 5 lb 4.3 oz (2.39 kg) F Vag-Spont Other  LIV     {Common ambulatory SmartLinks:19316}  Review of Systems {ros; complete:30496}    Objective:   There were no vitals filed for this visit. There is no height or weight on file to calculate BMI.  VS reviewed, nursing note reviewed,  Constitutional: well developed, well nourished, no distress HEENT: normocephalic, thyroid without enlargement or mass HEART: RRR, no murmurs rubs/gallops RESP: clear and equal to auscultation bilaterally in all lobes  Breast Exam:  ***Deferred with low risks and shared decision making, discussed recommendation to start mammogram between 40-50 yo/ exam performed: right breast normal without mass, skin or nipple changes or axillary nodes, left breast normal without mass, skin or nipple changes or axillary nodes Abdomen: soft Neuro: alert and oriented x 3 Skin: warm, dry Psych:  affect normal Pelvic exam: ***Deferred/ Performed: Cervix pink, visually closed, without lesion, scant white creamy discharge, vaginal walls and external genitalia normal Bimanual exam: Cervix 0/long/high, firm, anterior, neg CMT, uterus nontender, nonenlarged, adnexa without tenderness, enlargement, or mass        Assessment/Plan:   There are no diagnoses linked to this encounter.     No follow-ups on file.   Arlester Bence, CNM 8:25 AM

## 2024-02-28 ENCOUNTER — Ambulatory Visit

## 2024-03-31 ENCOUNTER — Other Ambulatory Visit (HOSPITAL_COMMUNITY)
Admission: RE | Admit: 2024-03-31 | Discharge: 2024-03-31 | Disposition: A | Discharge status: Home / Self Care | Source: Ambulatory Visit | Attending: Family | Admitting: Family

## 2024-03-31 ENCOUNTER — Ambulatory Visit: Admitting: Family

## 2024-03-31 ENCOUNTER — Ambulatory Visit: Payer: Self-pay | Admitting: Family

## 2024-03-31 ENCOUNTER — Encounter: Payer: Self-pay | Admitting: Family

## 2024-03-31 VITALS — BP 124/73 | HR 93 | Temp 99.1°F | Resp 16 | Ht 67.0 in | Wt 157.8 lb

## 2024-03-31 DIAGNOSIS — N898 Other specified noninflammatory disorders of vagina: Secondary | ICD-10-CM

## 2024-03-31 DIAGNOSIS — Z7689 Persons encountering health services in other specified circumstances: Secondary | ICD-10-CM

## 2024-03-31 DIAGNOSIS — F418 Other specified anxiety disorders: Secondary | ICD-10-CM

## 2024-03-31 DIAGNOSIS — Z3202 Encounter for pregnancy test, result negative: Secondary | ICD-10-CM

## 2024-03-31 DIAGNOSIS — F419 Anxiety disorder, unspecified: Secondary | ICD-10-CM

## 2024-03-31 LAB — POCT URINALYSIS DIP (CLINITEK)
Bilirubin, UA: NEGATIVE
Blood, UA: NEGATIVE
Glucose, UA: NEGATIVE mg/dL
Ketones, POC UA: NEGATIVE mg/dL
Leukocytes, UA: NEGATIVE
Nitrite, UA: NEGATIVE
POC PROTEIN,UA: NEGATIVE
Spec Grav, UA: 1.02 (ref 1.010–1.025)
Urobilinogen, UA: 0.2 U/dL
pH, UA: 7.5 (ref 5.0–8.0)

## 2024-03-31 LAB — POCT URINE PREGNANCY: Preg Test, Ur: NEGATIVE

## 2024-03-31 MED ORDER — HYDROXYZINE PAMOATE 25 MG PO CAPS: 25.0000 mg | ORAL_CAPSULE | Freq: Three times a day (TID) | ORAL | 2 refills | Status: DC | PRN

## 2024-03-31 NOTE — Progress Notes (Signed)
 Vaginal discharge, patient thinks she has some anxiety going on

## 2024-03-31 NOTE — Progress Notes (Signed)
 Subjective:    Jodi Roth - 28 y.o. female MRN [id number]  Date of birth: 1996/06/10  HPI  Jodi Roth is to establish care. .   Current issues and/or concerns: - Anxiety. States she is a single mother of 3 children. States she notices increased anxiety at doctor's appointments. States sometimes she has panic attacks. She would like to try a medication to help with anxiety. She would like referral to a therapist. She denies thoughts of self-harm, suicidal ideations, homicidal ideations. - Vaginal discharge for several days. Denies red flag symptoms. She would also like routine pregnancy test. - No further issues/concerns for discussion today.   ROS per HPI     Health Maintenance:   Health Maintenance Due  Topic Date Due   Hepatitis C Screening  Never done   Pneumococcal Vaccine 57-38 Years old (1 of 2 - PCV) Never done   Cervical Cancer Screening (Pap smear)  Never done     Past Medical History: Patient Active Problem List   Diagnosis Date Noted   Encounter for initial prescription of injectable contraceptive 06/12/2016   Postpartum care following vaginal delivery 06/12/2016   Pregnancy 10/16/2015      Social History   reports that she has never smoked. She has never used smokeless tobacco. She reports that she does not drink alcohol and does not use drugs.   Family History  family history is not on file.   Medications: reviewed and updated   Objective:   Physical Exam BP 124/73   Pulse 93   Temp 99.1 F (37.3 C) (Oral)   Resp 16   Ht 5' 7 (1.702 m)   Wt 157 lb 12.8 oz (71.6 kg)   LMP 03/07/2024 (Approximate)   SpO2 96%   BMI 24.71 kg/m   Physical Exam HENT:     Head: Normocephalic and atraumatic.     Nose: Nose normal.     Mouth/Throat:     Mouth: Mucous membranes are moist.     Pharynx: Oropharynx is clear.   Eyes:     Extraocular Movements: Extraocular movements intact.     Conjunctiva/sclera: Conjunctivae normal.     Pupils:  Pupils are equal, round, and reactive to light.    Cardiovascular:     Rate and Rhythm: Normal rate and regular rhythm.     Pulses: Normal pulses.     Heart sounds: Normal heart sounds.  Pulmonary:     Effort: Pulmonary effort is normal.     Breath sounds: Normal breath sounds.   Musculoskeletal:        General: Normal range of motion.     Cervical back: Normal range of motion and neck supple.   Neurological:     General: No focal deficit present.     Mental Status: She is alert and oriented to person, place, and time.   Psychiatric:        Mood and Affect: Mood normal.        Behavior: Behavior normal.        Assessment & Plan:  1. Encounter to establish care (Primary) - Patient presents today to establish care. During the interim follow-up with primary provider as scheduled.  - Return for annual physical examination, labs, and health maintenance. Arrive fasting meaning having no food for at least 8 hours prior to appointment. You may have only water or black coffee. Please take scheduled medications as normal.  2. Anxiety and depression - Patient denies thoughts of self-harm, suicidal ideations, homicidal ideations. -  Hydroxyzine as prescribed. Counseled on medication adherence/adverse effects.  - Referral to Psychiatry for evaluation/management.  - Follow-up with primary provider as scheduled. - hydrOXYzine (VISTARIL) 25 MG capsule; Take 1 capsule (25 mg total) by mouth every 8 (eight) hours as needed.  Dispense: 30 capsule; Refill: 2 - Ambulatory referral to Psychiatry  3. Vaginal discharge - Routine screening.  - Cervicovaginal ancillary only - POCT URINALYSIS DIP (CLINITEK); Future - POCT urine pregnancy; Future    Patient was given clear instructions to go to Emergency Department or return to medical center if symptoms don't improve, worsen, or new problems develop.The patient verbalized understanding.  I discussed the assessment and treatment plan with the  patient. The patient was provided an opportunity to ask questions and all were answered. The patient agreed with the plan and demonstrated an understanding of the instructions.   The patient was advised to call back or seek an in-person evaluation if the symptoms worsen or if the condition fails to improve as anticipated.    Greig Drones, NP 03/31/2024, 10:08 AM Primary Care at [facility]

## 2024-04-01 LAB — CERVICOVAGINAL ANCILLARY ONLY
Candida Glabrata: NEGATIVE
Candida Vaginitis: NEGATIVE
Comment: NEGATIVE
Comment: NEGATIVE
Comment: NEGATIVE
Comment: NEGATIVE
Comment: NEGATIVE
Comment: NORMAL
Trichomonas: NEGATIVE

## 2024-05-04 ENCOUNTER — Encounter: Payer: Self-pay | Admitting: Family

## 2024-05-04 ENCOUNTER — Ambulatory Visit (INDEPENDENT_AMBULATORY_CARE_PROVIDER_SITE_OTHER): Admitting: Family

## 2024-05-04 ENCOUNTER — Other Ambulatory Visit (HOSPITAL_COMMUNITY)
Admission: RE | Admit: 2024-05-04 | Discharge: 2024-05-04 | Disposition: A | Discharge status: Home / Self Care | Source: Ambulatory Visit | Attending: Family | Admitting: Family

## 2024-05-04 VITALS — BP 109/71 | HR 64 | Temp 98.3°F | Resp 16 | Ht 67.0 in | Wt 157.0 lb

## 2024-05-04 DIAGNOSIS — Z113 Encounter for screening for infections with a predominantly sexual mode of transmission: Secondary | ICD-10-CM | POA: Diagnosis not present

## 2024-05-04 DIAGNOSIS — Z130 Encounter for screening for diseases of the blood and blood-forming organs and certain disorders involving the immune mechanism: Secondary | ICD-10-CM | POA: Diagnosis not present

## 2024-05-04 DIAGNOSIS — Z13228 Encounter for screening for other metabolic disorders: Secondary | ICD-10-CM

## 2024-05-04 DIAGNOSIS — Z1329 Encounter for screening for other suspected endocrine disorder: Secondary | ICD-10-CM

## 2024-05-04 DIAGNOSIS — Z124 Encounter for screening for malignant neoplasm of cervix: Secondary | ICD-10-CM

## 2024-05-04 DIAGNOSIS — Z13220 Encounter for screening for lipoid disorders: Secondary | ICD-10-CM

## 2024-05-04 DIAGNOSIS — Z131 Encounter for screening for diabetes mellitus: Secondary | ICD-10-CM | POA: Diagnosis not present

## 2024-05-04 DIAGNOSIS — Z1159 Encounter for screening for other viral diseases: Secondary | ICD-10-CM | POA: Diagnosis not present

## 2024-05-04 DIAGNOSIS — Z114 Encounter for screening for human immunodeficiency virus [HIV]: Secondary | ICD-10-CM | POA: Diagnosis not present

## 2024-05-04 DIAGNOSIS — Z0000 Encounter for general adult medical examination without abnormal findings: Secondary | ICD-10-CM | POA: Diagnosis not present

## 2024-05-04 NOTE — Progress Notes (Signed)
 Blood work std testing

## 2024-05-04 NOTE — Progress Notes (Signed)
 Patient ID: Jodi Roth, female    DOB: [birth year]  MRN: [id number]  CC: Annual Exam  Subjective: Jodi Roth is a 27 y.o. female who presents for annual exam.   Her concerns today include:  - Routine STD testing. Denies symptoms/recent exposure.  Patient Active Problem List   Diagnosis Date Noted   Encounter for initial prescription of injectable contraceptive 06/12/2016   Postpartum care following vaginal delivery 06/12/2016   Pregnancy 10/16/2015     Current Outpatient Medications on File Prior to Visit  Medication Sig Dispense Refill   albuterol  (PROVENTIL ) (2.5 MG/3ML) 0.083% nebulizer solution Take 3 mLs (2.5 mg total) by nebulization every 6 (six) hours as needed for wheezing or shortness of breath. 75 mL 1   albuterol  (VENTOLIN  HFA) 108 (90 Base) MCG/ACT inhaler Inhale 2 puffs into the lungs every 4 (four) hours as needed for wheezing or shortness of breath. 54 g 0   fluticasone  (FLOVENT  HFA) 110 MCG/ACT inhaler Inhale 1 puff into the lungs 2 (two) times daily. 1 each 1   hydrOXYzine  (VISTARIL ) 25 MG capsule Take 1 capsule (25 mg total) by mouth every 8 (eight) hours as needed. 30 capsule 2   predniSONE  (DELTASONE ) 10 MG tablet Take 4 tablets (40 mg total) by mouth daily. 20 tablet 0   Biotin 5 MG TABS Take 1 tablet by mouth daily. (Patient not taking: Reported on 03/31/2024)     metroNIDAZOLE  (FLAGYL ) 500 MG tablet Take 1 tablet (500 mg total) by mouth 2 (two) times daily. (Patient not taking: Reported on 03/31/2024) 14 tablet 0   No current facility-administered medications on file prior to visit.    Allergies  Allergen Reactions   Penicillins Anaphylaxis    Has patient had a PCN reaction causing immediate rash, facial/tongue/throat swelling, SOB or lightheadedness with hypotension: Unknown  Has patient had a PCN reaction causing severe rash involving mucus membranes or skin necrosis: Unknown  Has patient had a PCN reaction that required hospitalization:  Yes  Has patient had a PCN reaction occurring within the last 10 years: No  If all of the above answers are NO, then may proceed with Cephalosporin use.  Other Reaction(s): edema  Has patient had a PCN reaction causing immediate rash, facial/tongue/throat swelling, SOB or lightheadedness with hypotension: Unknown  Has patient had a PCN reaction causing severe rash involving mucus membranes or skin necrosis: Unknown  Has patient had a PCN reaction that required hospitalization: Yes  Has patient had a PCN reaction occurring within the last 10 years: No  If all of the above answers are NO, then may proceed with Cephalosporin use.  Other Reaction(s): edema   Gentamicin  Other (See Comments)    Shaking, Near vagal reaction, to discuss with PCP.    Egg-Derived Products Nausea And Vomiting    Social History   Socioeconomic History   Marital status: Single    Spouse name: Not on file   Number of children: Not on file   Years of education: Not on file   Highest education level: Some college, no degree  Occupational History   Not on file  Tobacco Use   Smoking status: Never   Smokeless tobacco: Never  Vaping Use   Vaping status: Never Used  Substance and Sexual Activity   Alcohol use: No   Drug use: No   Sexual activity: Yes    Birth control/protection: None  Other Topics Concern   Not on file  Social History Narrative   Not on file  Social Drivers of Corporate investment banker Strain: Low Risk  (03/28/2024)   Overall Financial Resource Strain (CARDIA)    Difficulty of Paying Living Expenses: Not hard at all  Food Insecurity: No Food Insecurity (03/28/2024)   Hunger Vital Sign    Worried About Running Out of Food in the Last Year: Never true    Ran Out of Food in the Last Year: Never true  Transportation Needs: No Transportation Needs (03/28/2024)   PRAPARE - Administrator, Civil Service (Medical): No    Lack of Transportation (Non-Medical): No  Physical  Activity: Sufficiently Active (03/28/2024)   Exercise Vital Sign    Days of Exercise per Week: 5 days    Minutes of Exercise per Session: 90 min  Stress: No Stress Concern Present (03/28/2024)   [employer] of Occupational Health - Occupational Stress Questionnaire    Feeling of Stress: Only a little  Social Connections: Moderately Isolated (03/28/2024)   Social Connection and Isolation Panel    Frequency of Communication with Friends and Family: More than three times a week    Frequency of Social Gatherings with Friends and Family: Twice a week    Attends Religious Services: More than 4 times per year    Active Member of [employer] or Organizations: No    Attends Banker Meetings: Not on file    Marital Status: Never married  Intimate Partner Violence: Not At Risk (03/31/2024)   Humiliation, Afraid, Rape, and Kick questionnaire    Fear of Current or Ex-Partner: No    Emotionally Abused: No    Physically Abused: No    Sexually Abused: No    History reviewed. No pertinent family history.  Past Surgical History:  Procedure Laterality Date   NO PAST SURGERIES      ROS: Review of Systems Negative except as stated above  PHYSICAL EXAM: BP 109/71   Pulse 64   Temp 98.3 F (36.8 C) (Oral)   Resp 16   Ht 5' 7 (1.702 m)   Wt 157 lb (71.2 kg)   LMP 04/28/2024 (Exact Date)   SpO2 96%   BMI 24.59 kg/m   Physical Exam HENT:     Head: Normocephalic and atraumatic.     Right Ear: Tympanic membrane, ear canal and external ear normal.     Left Ear: Tympanic membrane, ear canal and external ear normal.     Nose: Nose normal.     Mouth/Throat:     Mouth: Mucous membranes are moist.     Pharynx: Oropharynx is clear.  Eyes:     Extraocular Movements: Extraocular movements intact.     Conjunctiva/sclera: Conjunctivae normal.     Pupils: Pupils are equal, round, and reactive to light.  Neck:     Thyroid: No thyroid mass, thyromegaly or thyroid tenderness.   Cardiovascular:     Rate and Rhythm: Normal rate and regular rhythm.     Pulses: Normal pulses.     Heart sounds: Normal heart sounds.  Pulmonary:     Effort: Pulmonary effort is normal.     Breath sounds: Normal breath sounds.  Chest:     Comments: Patient declined. Abdominal:     General: Bowel sounds are normal.     Palpations: Abdomen is soft.  Genitourinary:    Comments: Patient declined. Musculoskeletal:        General: Normal range of motion.     Right shoulder: Normal.     Left shoulder: Normal.  Right upper arm: Normal.     Left upper arm: Normal.     Right elbow: Normal.     Left elbow: Normal.     Right forearm: Normal.     Left forearm: Normal.     Right wrist: Normal.     Left wrist: Normal.     Right hand: Normal.     Left hand: Normal.     Cervical back: Normal, normal range of motion and neck supple.     Thoracic back: Normal.     Lumbar back: Normal.     Right hip: Normal.     Left hip: Normal.     Right upper leg: Normal.     Left upper leg: Normal.     Right knee: Normal.     Left knee: Normal.     Right lower leg: Normal.     Left lower leg: Normal.     Right ankle: Normal.     Left ankle: Normal.     Right foot: Normal.     Left foot: Normal.  Skin:    General: Skin is warm and dry.     Capillary Refill: Capillary refill takes less than 2 seconds.  Neurological:     General: No focal deficit present.     Mental Status: She is alert and oriented to person, place, and time.  Psychiatric:        Mood and Affect: Mood normal.        Behavior: Behavior normal.     ASSESSMENT AND PLAN: 1. Annual physical exam (Primary) - Counseled on 150 minutes of exercise per week as tolerated, healthy eating (including decreased daily intake of saturated fats, cholesterol, added sugars, sodium), STI prevention, and routine healthcare maintenance.  2. Screening for metabolic disorder - Routine screening.  - CMP14+EGFR  3. Screening for deficiency  anemia - Routine screening.  - CBC  4. Diabetes mellitus screening - Routine screening.  - Hemoglobin A1c  5. Screening cholesterol level - Routine screening.  - Lipid panel  6. Thyroid disorder screen - Routine screening.  - TSH  7. Need for hepatitis C screening test - Routine screening.  - Hepatitis C Antibody  8. Cervical cancer screening - Referral to Gynecology for evaluation/management. - Ambulatory referral to Gynecology  9. Routine screening for STI (sexually transmitted infection) - Routine screening.  - Cervicovaginal ancillary only  10. Encounter for screening for HIV - Routine screening.  - HIV antibody (with reflex)   Patient was given the opportunity to ask questions.  Patient verbalized understanding of the plan and was able to repeat key elements of the plan. Patient was given clear instructions to go to Emergency Department or return to medical center if symptoms don't improve, worsen, or new problems develop.The patient verbalized understanding.   Orders Placed This Encounter  Procedures   CBC   Lipid panel   CMP14+EGFR   Hemoglobin A1c   TSH   Hepatitis C Antibody   HIV antibody (with reflex)   Ambulatory referral to Gynecology    Return in about 1 year (around 05/04/2025) for Physical per patient preference.  Greig JINNY Drones, NP

## 2024-05-05 ENCOUNTER — Ambulatory Visit: Payer: Self-pay | Admitting: Family

## 2024-05-05 DIAGNOSIS — B3731 Acute candidiasis of vulva and vagina: Secondary | ICD-10-CM

## 2024-05-05 DIAGNOSIS — B9689 Other specified bacterial agents as the cause of diseases classified elsewhere: Secondary | ICD-10-CM

## 2024-05-05 LAB — HEMOGLOBIN A1C
Est. average glucose Bld gHb Est-mCnc: 114 mg/dL
Hgb A1c MFr Bld: 5.6 % (ref 4.8–5.6)

## 2024-05-05 LAB — LIPID PANEL
Chol/HDL Ratio: 2.9 ratio (ref 0.0–4.4)
Cholesterol, Total: 159 mg/dL (ref 100–199)
HDL: 55 mg/dL (ref >39)
LDL Chol Calc (NIH): 91 mg/dL (ref 0–99)
Triglycerides: 64 mg/dL (ref 0–149)
VLDL Cholesterol Cal: 13 mg/dL (ref 5–40)

## 2024-05-05 LAB — CBC
Hematocrit: 39.8 % (ref 34.0–46.6)
Hemoglobin: 12.4 g/dL (ref 11.1–15.9)
MCH: 30 pg (ref 26.6–33.0)
MCHC: 31.2 g/dL — ABNORMAL LOW (ref 31.5–35.7)
MCV: 96 fL (ref 79–97)
Platelets: 263 x10E3/uL (ref 150–450)
RBC: 4.13 x10E6/uL (ref 3.77–5.28)
RDW: 13 % (ref 11.7–15.4)
WBC: 7.7 x10E3/uL (ref 3.4–10.8)

## 2024-05-05 LAB — CMP14+EGFR
ALT: 12 IU/L (ref 0–32)
AST: 18 IU/L (ref 0–40)
Albumin: 4.5 g/dL (ref 4.0–5.0)
Alkaline Phosphatase: 47 IU/L (ref 44–121)
BUN/Creatinine Ratio: 14 (ref 9–23)
BUN: 11 mg/dL (ref 6–20)
Bilirubin Total: <0.2 mg/dL (ref 0.0–1.2)
CO2: 21 mmol/L (ref 20–29)
Calcium: 9.3 mg/dL (ref 8.7–10.2)
Chloride: 105 mmol/L (ref 96–106)
Creatinine, Ser: 0.78 mg/dL (ref 0.57–1.00)
Globulin, Total: 2.4 g/dL (ref 1.5–4.5)
Glucose: 74 mg/dL (ref 70–99)
Potassium: 4.5 mmol/L (ref 3.5–5.2)
Sodium: 140 mmol/L (ref 134–144)
Total Protein: 6.9 g/dL (ref 6.0–8.5)
eGFR: 106 mL/min/1.73 (ref >59)

## 2024-05-05 LAB — CERVICOVAGINAL ANCILLARY ONLY
Bacterial Vaginitis (gardnerella): POSITIVE — AB
Candida Glabrata: NEGATIVE
Candida Vaginitis: POSITIVE — AB
Chlamydia: NEGATIVE
Comment: NEGATIVE
Comment: NEGATIVE
Comment: NEGATIVE
Comment: NEGATIVE
Comment: NEGATIVE
Comment: NORMAL
Neisseria Gonorrhea: NEGATIVE
Trichomonas: NEGATIVE

## 2024-05-05 LAB — HIV ANTIBODY (ROUTINE TESTING W REFLEX): HIV Screen 4th Generation wRfx: NONREACTIVE

## 2024-05-05 LAB — TSH: TSH: 2.07 u[IU]/mL (ref 0.450–4.500)

## 2024-05-05 LAB — HEPATITIS C ANTIBODY: Hep C Virus Ab: NONREACTIVE

## 2024-05-06 MED ORDER — METRONIDAZOLE 500 MG PO TABS: 500.0000 mg | ORAL_TABLET | Freq: Two times a day (BID) | ORAL | 0 refills | Status: AC

## 2024-05-06 MED ORDER — FLUCONAZOLE 150 MG PO TABS: 150.0000 mg | ORAL_TABLET | ORAL | 0 refills | Status: AC

## 2024-06-18 ENCOUNTER — Emergency Department (HOSPITAL_BASED_OUTPATIENT_CLINIC_OR_DEPARTMENT_OTHER)

## 2024-06-18 ENCOUNTER — Encounter (HOSPITAL_BASED_OUTPATIENT_CLINIC_OR_DEPARTMENT_OTHER): Payer: Self-pay | Admitting: Emergency Medicine

## 2024-06-18 ENCOUNTER — Other Ambulatory Visit: Payer: Self-pay

## 2024-06-18 ENCOUNTER — Emergency Department (HOSPITAL_BASED_OUTPATIENT_CLINIC_OR_DEPARTMENT_OTHER)
Admission: EM | Admit: 2024-06-18 | Discharge: 2024-06-18 | Disposition: A | Discharge status: Home / Self Care | Attending: Emergency Medicine | Admitting: Emergency Medicine

## 2024-06-18 DIAGNOSIS — Z7951 Long term (current) use of inhaled steroids: Secondary | ICD-10-CM | POA: Insufficient documentation

## 2024-06-18 DIAGNOSIS — Z7952 Long term (current) use of systemic steroids: Secondary | ICD-10-CM | POA: Insufficient documentation

## 2024-06-18 DIAGNOSIS — J4541 Moderate persistent asthma with (acute) exacerbation: Secondary | ICD-10-CM | POA: Insufficient documentation

## 2024-06-18 DIAGNOSIS — R059 Cough, unspecified: Secondary | ICD-10-CM | POA: Diagnosis not present

## 2024-06-18 DIAGNOSIS — R0602 Shortness of breath: Secondary | ICD-10-CM | POA: Diagnosis present

## 2024-06-18 LAB — PREGNANCY, URINE: Preg Test, Ur: NEGATIVE

## 2024-06-18 LAB — RESP PANEL BY RT-PCR (RSV, FLU A&B, COVID)  RVPGX2
Influenza A by PCR: NEGATIVE
Influenza B by PCR: NEGATIVE
Resp Syncytial Virus by PCR: NEGATIVE
SARS Coronavirus 2 by RT PCR: NEGATIVE

## 2024-06-18 MED ORDER — PREDNISONE 50 MG PO TABS: ORAL_TABLET | ORAL | 0 refills | Status: AC

## 2024-06-18 MED ORDER — ALBUTEROL SULFATE (2.5 MG/3ML) 0.083% IN NEBU
5.0000 mg | INHALATION_SOLUTION | Freq: Once | RESPIRATORY_TRACT | Status: AC
  Administered 2024-06-18: 5 mg via RESPIRATORY_TRACT
  Filled 2024-06-18: qty 6

## 2024-06-18 MED ORDER — ALBUTEROL SULFATE HFA 108 (90 BASE) MCG/ACT IN AERS
2.0000 | INHALATION_SPRAY | RESPIRATORY_TRACT | Status: DC | PRN
  Administered 2024-06-18: 2 via RESPIRATORY_TRACT
  Filled 2024-06-18 (×2): qty 6.7

## 2024-06-18 MED ORDER — PREDNISONE 50 MG PO TABS
60.0000 mg | ORAL_TABLET | Freq: Once | ORAL | Status: AC
  Administered 2024-06-18: 60 mg via ORAL
  Filled 2024-06-18: qty 1

## 2024-06-18 MED ORDER — FLUTICASONE PROPIONATE HFA 110 MCG/ACT IN AERO: 1.0000 | INHALATION_SPRAY | Freq: Two times a day (BID) | RESPIRATORY_TRACT | 12 refills | Status: AC

## 2024-06-18 MED ORDER — ALBUTEROL SULFATE HFA 108 (90 BASE) MCG/ACT IN AERS: 1.0000 | INHALATION_SPRAY | Freq: Four times a day (QID) | RESPIRATORY_TRACT | 0 refills | Status: AC | PRN

## 2024-06-18 MED ORDER — IPRATROPIUM BROMIDE 0.02 % IN SOLN
0.5000 mg | Freq: Once | RESPIRATORY_TRACT | Status: AC
  Administered 2024-06-18: 0.5 mg via RESPIRATORY_TRACT
  Filled 2024-06-18: qty 2.5

## 2024-06-18 NOTE — ED Triage Notes (Signed)
 Pt reports SOB and cough/congestion x 2 days, hx of asthma, used inhaler but it ran out today, RT at bedside upon pt arrival

## 2024-06-18 NOTE — ED Provider Notes (Signed)
  EMERGENCY DEPARTMENT AT [facility] Provider Note   CSN: [id number] Arrival date & time: 06/18/24  0011     Patient presents with: Shortness of Breath   Jodi Roth is a 28 y.o. female.   The history is provided by the patient.  Patient with history of asthma presents with shortness of breath.  Patient reports over the past 2 days she has had increasing cough, wheezing, chest tightness and shortness of breath.  No fevers.  No hemoptysis.  This feels similar to prior episodes of asthma.  She reports running out of her albuterol  inhaler.  She recently established with a PCP and is waiting on a referral to a specialist. She reports history of hospitalizations previously as a child    Past Medical History:  Diagnosis Date   Asthma    BV (bacterial vaginosis)     Prior to Admission medications   Medication Sig Start Date End Date Taking? Authorizing Provider  albuterol  (VENTOLIN  HFA) 108 (90 Base) MCG/ACT inhaler Inhale 1-2 puffs into the lungs every 6 (six) hours as needed for wheezing or shortness of breath. 06/18/24  Yes Midge Golas, MD  fluticasone  (FLOVENT  HFA) 110 MCG/ACT inhaler Inhale 1 puff into the lungs 2 (two) times daily. 06/18/24  Yes Midge Golas, MD  predniSONE  (DELTASONE ) 50 MG tablet 1 tablet PO QD X4 days 06/18/24  Yes Midge Golas, MD  Biotin 5 MG TABS Take 1 tablet by mouth daily. Patient not taking: Reported on 03/31/2024    [provider]  hydrOXYzine  (VISTARIL ) 25 MG capsule Take 1 capsule (25 mg total) by mouth every 8 (eight) hours as needed. 03/31/24   Lorren Greig PARAS, NP    Allergies: Penicillins, Gentamicin , and Egg-derived products    Review of Systems  Constitutional:  Negative for fever.  Respiratory:  Positive for shortness of breath and wheezing.   Cardiovascular:  Negative for leg swelling.    Updated Vital Signs BP 130/67   Pulse 80   Temp 98.4 F (36.9 C) (Oral)   Resp 20   Ht 1.702 m (5' 7)    Wt 68 kg   LMP 05/28/2024 (Exact Date)   SpO2 100%   BMI 23.49 kg/m   Physical Exam CONSTITUTIONAL: Well developed/well nourished, no distress HEAD: Normocephalic/atraumatic EYES: EOMI/PERRL ENMT: Mucous membranes moist, no stridor NECK: supple no meningeal signs CV: S1/S2 noted, no murmurs/rubs/gallops noted LUNGS: Wheezing bilaterally, no rales ABDOMEN: soft NEURO: Pt is awake/alert/appropriate, moves all extremitiesx4.  No facial droop.   EXTREMITIES: pulses normal/equal, full ROM, no lower extremity edema SKIN: warm, color normal PSYCH: no abnormalities of mood noted, alert and oriented to situation  (all labs ordered are listed, but only abnormal results are displayed) Labs Reviewed  RESP PANEL BY RT-PCR (RSV, FLU A&B, COVID)  [id number]  PREGNANCY, URINE    EKG: None  Radiology: DG Chest 2 View Result Date: 06/18/2024 EXAM: 2 VIEW(S) XRAY OF THE CHEST 06/18/2024 12:46:00 AM COMPARISON: 12/13/2023 CLINICAL HISTORY: Shortness of breath. Pt reports SOB and cough/congestion x 2 days, hx of asthma, used inhaler but it ran out today. Pt shielded. FINDINGS: LUNGS AND PLEURA: No focal pulmonary opacity. No pulmonary edema. No pleural effusion. No pneumothorax. HEART AND MEDIASTINUM: No acute abnormality of the cardiac and mediastinal silhouettes. BONES AND SOFT TISSUES: Mild thoracic dextroscoliosis, unchanged. No acute osseous abnormality. IMPRESSION: 1. No acute process. Electronically signed by: Norman Gatlin MD 06/18/2024 12:54 AM EDT RP Workstation: [id number]     Procedures  Medications Ordered in the ED  albuterol  (VENTOLIN  HFA) 108 (90 Base) MCG/ACT inhaler 2 puff (2 puffs Inhalation Given 06/18/24 0034)  albuterol  (PROVENTIL ) (2.5 MG/3ML) 0.083% nebulizer solution 5 mg (5 mg Nebulization Given 06/18/24 0049)  ipratropium (ATROVENT ) nebulizer solution 0.5 mg (0.5 mg Nebulization Given 06/18/24 0049)  predniSONE  (DELTASONE ) tablet 60 mg (60 mg Oral Given 06/18/24 0101)                                     Medical Decision Making Amount and/or Complexity of Data Reviewed Labs: ordered. Radiology: ordered.  Risk Prescription drug management.   This patient presents to the ED for concern of shortness of breath, this involves an extensive number of treatment options, and is a complaint that carries with it a high risk of complications and morbidity.  The differential diagnosis includes but is not limited to Acute coronary syndrome, pneumonia, acute pulmonary edema, pneumothorax, acute anemia, pulmonary embolism Asthma exacerbation  Comorbidities that complicate the patient evaluation: Patient's presentation is complicated by their history of asthma  Social Determinants of Health: Patient's impaired access to specialist  increases the complexity of managing their presentation  Additional history obtained: Records reviewed previous admission documents  Lab Tests: I Ordered, and personally interpreted labs.  The pertinent results include: Viral panel negative  Imaging Studies ordered: I ordered imaging studies including X-ray chest  I independently visualized and interpreted imaging which showed no acute finding I agree with the radiologist interpretation  Medicines ordered and prescription drug management: I ordered medication including nebulizer therapies and steroid for asthma Reevaluation of the patient after these medicines showed that the patient    improved  Reevaluation: After the interventions noted above, I reevaluated the patient and found that they have :improved  Complexity of problems addressed: Patient's presentation is most consistent with  exacerbation of chronic illness  Disposition: After consideration of the diagnostic results and the patient's response to treatment,  I feel that the patent would benefit from discharge  .   Overall patient is improved.  She still has wheeze but her work of breathing is improved, no  hypoxia Will prescribe albuterol  and prednisone  for exacerbation.  Patient has used Flovent  before, this will be prescribed to take on a daily basis     Final diagnoses:  Moderate persistent asthma with exacerbation    ED Discharge Orders          Ordered    albuterol  (VENTOLIN  HFA) 108 (90 Base) MCG/ACT inhaler  Every 6 hours PRN        06/18/24 0148    predniSONE  (DELTASONE ) 50 MG tablet        06/18/24 0148    fluticasone  (FLOVENT  HFA) 110 MCG/ACT inhaler  2 times daily        06/18/24 0148               Midge Golas, MD 06/18/24 [phone]

## 2024-06-29 ENCOUNTER — Encounter (HOSPITAL_COMMUNITY): Payer: Self-pay

## 2024-06-29 ENCOUNTER — Ambulatory Visit (HOSPITAL_COMMUNITY): Payer: Self-pay | Admitting: Mental Health

## 2024-08-13 ENCOUNTER — Ambulatory Visit (INDEPENDENT_AMBULATORY_CARE_PROVIDER_SITE_OTHER): Admitting: Mental Health

## 2024-08-13 DIAGNOSIS — F330 Major depressive disorder, recurrent, mild: Secondary | ICD-10-CM | POA: Diagnosis not present

## 2024-08-13 DIAGNOSIS — F411 Generalized anxiety disorder: Secondary | ICD-10-CM | POA: Diagnosis not present

## 2024-08-13 DIAGNOSIS — F320 Major depressive disorder, single episode, mild: Secondary | ICD-10-CM | POA: Insufficient documentation

## 2024-08-13 DIAGNOSIS — F321 Major depressive disorder, single episode, moderate: Secondary | ICD-10-CM
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
## 2024-09-20 ENCOUNTER — Encounter: Payer: Self-pay | Admitting: Emergency Medicine

## 2024-09-20 ENCOUNTER — Ambulatory Visit
Admission: EM | Admit: 2024-09-20 | Discharge: 2024-09-20 | Disposition: A | Discharge status: Home / Self Care | Source: Home / Self Care | Attending: Family Medicine | Admitting: Family Medicine

## 2024-09-20 DIAGNOSIS — Z113 Encounter for screening for infections with a predominantly sexual mode of transmission: Secondary | ICD-10-CM | POA: Insufficient documentation

## 2024-09-20 DIAGNOSIS — N898 Other specified noninflammatory disorders of vagina: Secondary | ICD-10-CM | POA: Diagnosis present

## 2024-09-20 LAB — POCT URINE PREGNANCY: Preg Test, Ur: NEGATIVE

## 2024-09-20 MED ORDER — METRONIDAZOLE 500 MG PO TABS: 500.0000 mg | ORAL_TABLET | Freq: Two times a day (BID) | ORAL | 0 refills | Status: AC

## 2024-09-20 NOTE — ED Provider Notes (Signed)
 [facility]    CSN: [id number] Arrival date & time: 09/20/24  9170      History   Chief Complaint Chief Complaint  Patient presents with   Vaginal Discharge    HPI Deboraha Carpenter is a 28 y.o. female.    Vaginal Discharge  Patient is here for vaginal d/c x several weeks.  White, thick, clumpy.  No itching.  She does have h/o BV, not sure if that or not.  She would like to be tested for all STDs today as well.  No urinary symptoms.       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 Authorizing Provider  albuterol  (VENTOLIN  HFA) 108 (90 Base) MCG/ACT inhaler Inhale 1-2 puffs into the lungs every 6 (six) hours as needed for wheezing or shortness of breath. 06/18/24  Yes Midge Golas, MD  fluticasone  (FLOVENT  HFA) 110 MCG/ACT inhaler Inhale 1 puff into the lungs 2 (two) times daily. 06/18/24  Yes Midge Golas, MD  Biotin 5 MG TABS Take 1 tablet by mouth daily. Patient not taking: Reported on 03/31/2024    [provider]  fluconazole  (DIFLUCAN ) 150 MG tablet Take 150 mg by mouth. Patient not taking: Reported on 09/20/2024 10/10/23   [provider]  hydrOXYzine  (VISTARIL ) 25 MG capsule Take 1 capsule (25 mg total) by mouth every 8 (eight) hours as  needed. Patient not taking: Reported on 09/20/2024 03/31/24   Jaycee Greig PARAS, NP  predniSONE  (DELTASONE ) 50 MG tablet 1 tablet PO QD X4 days Patient not taking: Reported on 09/20/2024 06/18/24   Midge Golas, MD    Family History History reviewed. No pertinent family history.  Social History Social History[1]   Allergies   Penicillins, Gentamicin , and Egg protein-containing drug products   Review of Systems Review of Systems  Constitutional: Negative.   HENT: Negative.    Respiratory: Negative.    Cardiovascular: Negative.   Gastrointestinal: Negative.   Genitourinary:  Positive for vaginal discharge.  Musculoskeletal: Negative.   Psychiatric/Behavioral: Negative.       Physical Exam Triage Vital Signs ED Triage Vitals  Encounter Vitals Group     BP 09/20/24 0854 113/71     Girls Systolic BP Percentile --      Girls Diastolic BP Percentile --      Boys Systolic BP Percentile --      Boys Diastolic BP Percentile --      Pulse Rate 09/20/24 0854 67     Resp 09/20/24 0854 12     Temp 09/20/24 0854  98.1 F (36.7 C)     Temp Source 09/20/24 0854 Oral     SpO2 09/20/24 0854 96 %     Weight --      Height --      Head Circumference --      Peak Flow --      Pain Score 09/20/24 0852 0     Pain Loc --      Pain Education --      Exclude from Growth Chart --    No data found.  Updated Vital Signs BP 113/71 (BP Location: Left Arm)   Pulse 67   Temp 98.1 F (36.7 C) (Oral)   Resp 12   LMP 09/13/2024 (Exact Date)   SpO2 96%   Visual Acuity Right Eye Distance:   Left Eye Distance:   Bilateral Distance:    Right Eye Near:   Left Eye Near:    Bilateral Near:     Physical Exam Constitutional:      General: She is not in acute distress.    Appearance: Normal appearance. She is not ill-appearing or toxic-appearing.  Cardiovascular:     Rate and Rhythm: Normal rate and regular rhythm.  Pulmonary:     Effort: Pulmonary effort is normal.     Breath sounds:  Normal breath sounds.  Neurological:     General: No focal deficit present.     Mental Status: She is alert.  Psychiatric:        Mood and Affect: Mood normal.      UC Treatments / Results  Labs (all labs ordered are listed, but only abnormal results are displayed) Labs Reviewed  POCT URINE PREGNANCY - Normal  SYPHILIS: RPR W/REFLEX TO RPR TITER AND TREPONEMAL ANTIBODIES, TRADITIONAL SCREENING AND DIAGNOSIS ALGORITHM  HIV ANTIBODY (ROUTINE TESTING W REFLEX)  CERVICOVAGINAL ANCILLARY ONLY   Upt negative  EKG   Radiology No results found.  Procedures Procedures (including critical care time)  Medications Ordered in UC Medications - No data to display  Initial Impression / Assessment and Plan / UC Course  I have reviewed the triage vital signs and the nursing notes.  Pertinent labs & imaging results that were available during my care of the patient were reviewed by me and considered in my medical decision making (see chart for details).   Final Clinical Impressions(s) / UC Diagnoses   Final diagnoses:  Vaginal discharge  Screening for STD (sexually transmitted disease)     Discharge Instructions      You were seen today for vaginal discharge and STD screening.  I have sent out flagyl  for possible BV. Your blood work and vaginal swab will be resulted tomorrow.  You will be notified for treatment if anything else is positive on those tests.     ED Prescriptions     Medication Sig Dispense Auth. Provider   metroNIDAZOLE  (FLAGYL ) 500 MG tablet Take 1 tablet (500 mg total) by mouth 2 (two) times daily. 14 tablet Darral Longs, MD      PDMP not reviewed this encounter.    [1]  Social History Tobacco Use   Smoking status: Never   Smokeless tobacco: Never  Vaping Use   Vaping status: Never Used  Substance Use Topics   Alcohol use: No   Drug use: No     Darral Longs, MD 09/20/24 [phone]

## 2024-09-20 NOTE — Discharge Instructions (Signed)
 You were seen today for vaginal discharge and STD screening.  I have sent out flagyl  for possible BV. Your blood work and vaginal swab will be resulted tomorrow.  You will be notified for treatment if anything else is positive on those tests.

## 2024-09-20 NOTE — ED Triage Notes (Signed)
 Pt reports vaginal discharge x1 week. Describes clumpy, white discharge. Denies vaginal itching, vaginal pain, or dysuria. No other associated symptoms. Pt would like to complete STD testing - cytology & blood draw. No known exposures. Pt reports LMP: 09/13/24-12/12. No med use for symptoms.

## 2024-09-21 ENCOUNTER — Ambulatory Visit (HOSPITAL_COMMUNITY): Payer: Self-pay

## 2024-09-21 LAB — CERVICOVAGINAL ANCILLARY ONLY
Bacterial Vaginitis (gardnerella): POSITIVE — AB
Candida Glabrata: NEGATIVE
Candida Vaginitis: NEGATIVE
Chlamydia: NEGATIVE
Comment: NEGATIVE
Comment: NEGATIVE
Comment: NEGATIVE
Comment: NEGATIVE
Comment: NEGATIVE
Comment: NORMAL
Neisseria Gonorrhea: NEGATIVE
Trichomonas: NEGATIVE

## 2024-09-21 LAB — HIV ANTIBODY (ROUTINE TESTING W REFLEX): HIV Screen 4th Generation wRfx: NONREACTIVE

## 2024-09-21 LAB — SYPHILIS: RPR W/REFLEX TO RPR TITER AND TREPONEMAL ANTIBODIES, TRADITIONAL SCREENING AND DIAGNOSIS ALGORITHM: RPR Ser Ql: NONREACTIVE

## 2024-10-08 ENCOUNTER — Ambulatory Visit (HOSPITAL_COMMUNITY): Admitting: Mental Health
